# Patient Record
Sex: Female | Born: 1965 | Race: White | Hispanic: Yes | Smoking: Never smoker
Health system: Southern US, Community
[De-identification: ages and names within clinical notes are randomized; demographics above are authoritative.]

## PROBLEM LIST (undated history)

## (undated) DIAGNOSIS — G971 Other reaction to spinal and lumbar puncture: Secondary | ICD-10-CM

## (undated) DIAGNOSIS — Z973 Presence of spectacles and contact lenses: Secondary | ICD-10-CM

## (undated) DIAGNOSIS — E7800 Pure hypercholesterolemia, unspecified: Secondary | ICD-10-CM

## (undated) DIAGNOSIS — Z920 Personal history of contraception: Secondary | ICD-10-CM

## (undated) DIAGNOSIS — D649 Anemia, unspecified: Secondary | ICD-10-CM

## (undated) DIAGNOSIS — I10 Essential (primary) hypertension: Secondary | ICD-10-CM

## (undated) DIAGNOSIS — N941 Unspecified dyspareunia: Secondary | ICD-10-CM

## (undated) DIAGNOSIS — M1990 Unspecified osteoarthritis, unspecified site: Secondary | ICD-10-CM

## (undated) DIAGNOSIS — K5790 Diverticulosis of intestine, part unspecified, without perforation or abscess without bleeding: Secondary | ICD-10-CM

## (undated) HISTORY — DX: Unspecified osteoarthritis, unspecified site: M19.90

## (undated) HISTORY — DX: Diverticulosis of intestine, part unspecified, without perforation or abscess without bleeding: K57.90

## (undated) HISTORY — DX: Unspecified dyspareunia: N94.10

## (undated) HISTORY — PX: KNEE ARTHROSCOPY: SUR90

## (undated) HISTORY — PX: COLONOSCOPY: SHX174

## (undated) HISTORY — DX: Personal history of contraception: Z92.0

## (undated) HISTORY — PX: OTHER SURGICAL HISTORY: SHX169

---

## 2002-04-11 ENCOUNTER — Other Ambulatory Visit: Admission: RE | Admit: 2002-04-11 | Discharge: 2002-04-11 | Payer: Self-pay | Admitting: Internal Medicine

## 2003-04-15 ENCOUNTER — Other Ambulatory Visit: Admission: RE | Admit: 2003-04-15 | Discharge: 2003-04-15 | Payer: Self-pay | Admitting: Internal Medicine

## 2003-05-29 ENCOUNTER — Ambulatory Visit (HOSPITAL_BASED_OUTPATIENT_CLINIC_OR_DEPARTMENT_OTHER): Admission: RE | Admit: 2003-05-29 | Discharge: 2003-05-29 | Payer: Self-pay | Admitting: Orthopedic Surgery

## 2004-09-15 ENCOUNTER — Other Ambulatory Visit: Admission: RE | Admit: 2004-09-15 | Discharge: 2004-09-15 | Payer: Self-pay | Admitting: Internal Medicine

## 2006-06-28 ENCOUNTER — Ambulatory Visit: Payer: Self-pay | Admitting: Oncology

## 2010-06-05 NOTE — Op Note (Signed)
NAMEMANNIE, Ashley [name]                             ACCOUNT NO.:  [id number]   MEDICAL RECORD NO.:  [id number]                   PATIENT TYPE:  AMB   LOCATION:  DSC                                  FACILITY:  [facility]   PHYSICIAN:  Harvie Junior, M.D.                DATE OF BIRTH:  January 07, 1966   DATE OF PROCEDURE:  05/29/2003  DATE OF DISCHARGE:                                 OPERATIVE REPORT   PREOPERATIVE DIAGNOSIS:  Osteochondral defect medial femoral condyle.   POSTOPERATIVE DIAGNOSES:  1. Osteochondral defect medial femoral condyle.  2. Multiple osteocartilaginous loose bodies.  3. Chondromalacia patella.   PROCEDURES:  1. Debridement of medial femoral condyle with a microfracture technique.  2. Debridement of patellofemoral chondromalacia by way of chondroplasty.  3. Removal of multiple osteocartilaginous loose bodies.   SURGEON:  Harvie Junior, M.D.   ASSISTANT:  Marshia Ly, P.A.   ANESTHESIA:  General.   INDICATIONS:  [name] is a 45 year old female with a long history of  having significant pain in her right knee.  She was ultimately evaluated in  our office and by MRI and found to have an unstable articular cartilage  lesion.  She had tried conservative care which was not successful.  Because  of continued complaints of pain, locking and catching, she is taken to the  operating room for operative knee arthroscopy.   DESCRIPTION OF PROCEDURE:  The patient was taken to operating room.  After  adequate anesthesia was obtained with a general anesthetic, the patient was  placed supine on the operating table with the right knee prepped and draped  in the usual sterile fashion.  Following this, routine arthroscopic  examination of the knee revealed that there was an obvious large  osteochondral flap lesion on the medial femoral condyle.  This was evaluated  and noted to have no bone on the back side of the articular cartilage and  such would not be amenable to any sort of  healing technique.  At this time,  the flap was debrided and the base of the crater was awled with the angled  awls.  Excellent bleeding was revealed at the base of the crater.  Attention  at the time was turned to the rest of the knee. During the knee exam,  multiple osteocartilaginous loose bodies were encountered and removed with  the grasper.  The medial and lateral meniscus were probed and felt to be  within normal limits.  ACL was within normal limits.   Attention was turned to the patellofemoral joint where there was some grade  II chondromalacia on the posterior surface of the patella which was  debrided.  The patellar tracking was midline.  Final check was made for any  loose  fragmented pieces, seeing none.  The knee was then copiously irrigated and  suctioned dry.  The wounds were then closed with a bandage.  Sterile  compressive dressing was applied, and the patient was taken to the recovery  room and noted to be in satisfactory condition.  Estimated blood loss for  the procedure was none.                                               Harvie Junior, M.D.    Ranae Plumber  D:  05/29/2003  T:  05/30/2003  Job:  [id number]

## 2010-09-08 ENCOUNTER — Ambulatory Visit (HOSPITAL_BASED_OUTPATIENT_CLINIC_OR_DEPARTMENT_OTHER)
Admission: RE | Admit: 2010-09-08 | Discharge: 2010-09-08 | Disposition: A | Discharge status: Home / Self Care | Payer: BC Managed Care – PPO | Source: Ambulatory Visit | Attending: Orthopaedic Surgery | Admitting: Orthopaedic Surgery

## 2010-09-08 DIAGNOSIS — IMO0002 Reserved for concepts with insufficient information to code with codable children: Secondary | ICD-10-CM | POA: Insufficient documentation

## 2010-09-08 DIAGNOSIS — Z01812 Encounter for preprocedural laboratory examination: Secondary | ICD-10-CM | POA: Insufficient documentation

## 2010-09-08 DIAGNOSIS — X58XXXA Exposure to other specified factors, initial encounter: Secondary | ICD-10-CM | POA: Insufficient documentation

## 2010-09-08 DIAGNOSIS — M1710 Unilateral primary osteoarthritis, unspecified knee: Secondary | ICD-10-CM | POA: Insufficient documentation

## 2010-09-08 LAB — POCT HEMOGLOBIN-HEMACUE: Hemoglobin: 13.5 g/dL (ref 12.0–15.0)

## 2010-09-13 NOTE — Op Note (Signed)
[name], STAFFA               ACCOUNT NO.:  [id number]  MEDICAL RECORD NO.:  [id number]  LOCATION:                                 FACILITY:  PHYSICIAN:  Ashley [name]. Ashley [name], M.D.     DATE OF BIRTH:  DATE OF PROCEDURE:  09/08/2010 DATE OF DISCHARGE:                              OPERATIVE REPORT   PREOPERATIVE DIAGNOSES: 1. Knee torn medial meniscus. 2. Right knee degenerative joint disease.  POSTOPERATIVE DIAGNOSES: 1. Knee torn medial meniscus. 2. Right knee degenerative joint disease.  PROCEDURES: 1. Right knee partial meniscectomy. 2. Right knee abrasion chondroplasty.  ANESTHESIA:  General.  ATTENDING SURGEON:  Ashley [name]. Ashley Blatchley, MD   INDICATIONS FOR PROCEDURE:  The patient is a 45 year old woman with a long history of right knee pain.  She had an arthroscopy 8 or 9 years back which helped for quite sometime.  Since that time she developed recurrent pain over the last year or 2.  We treated with various injectables as well as pills.  She has pain which limits her ability to rest and walk.  She does have some degenerative changes which were moderate and at age 70 we are hoping that an arthroscopy will delay the inevitable knee replacement.  Informed operative consent was obtained after discussion of possible complications including reaction to anesthesia and infection.  SUMMARY/FINDINGS/PROCEDURE:  Under general anesthesia, an arthroscopy of the right knee was performed.  She did have an authentic medial meniscus tear which appeared to be impinging in the joint.  A partial medial meniscectomy was done removing about 10% of what was left of her meniscus.  This was taken back to a stable rim.  She did have some grade 4 change on tibial plateau in a dime sized area and some corresponding areas on the femur.  ACL was intact.  There was bit of a spur anterior to this which I removed a portion up.  The lateral compartment was completely benign.  The patellofemoral  portion of the knee exhibited some significant grade 4 change across the broad area in the inter- trochlear groove.  This also at the apex of patella, some grade 3 change.  A thorough chondroplasty was done along with abrasion to bleeding bone in some small areas.  DESCRIPTION OF PROCEDURE:  The patient was brought to the operating suite where general anesthetic was applied without difficulty.  She was positioned in supine, prepped and draped in normal sterile fashion. After administration of IV Kefzol, an arthroscopy of the right knee was formed through total of two portals.  Findings were as noted above and procedure consisted of a partial medial meniscectomy done with a basket and shaver followed by abrasion chondroplasty as outlined above. Thorough chondroplasties were also done medial and undersurface of the patella.  The knee was thoroughly irrigated followed by placement of Marcaine with epinephrine and morphine.  Adaptic was placed on the portals followed by dry gauze and loose Ace wrap.  Estimated blood loss and fluids obtained from anesthesia records.  DISPOSITION:  The patient was extubated in the operating room and taken to recovery room in stable addition.  She wish to go home same day and follow  up in the office once a week.  I will contact her by phone tonight.     Ashley [name], M.D.     PGD/MEDQ  D:  09/08/2010  T:  09/08/2010  Job:  [id number]  Electronically Signed by Ashley [name] M.D. on 09/13/2010 12:02:27 PM

## 2014-12-04 HISTORY — PX: COLONOSCOPY: SHX174

## 2015-08-19 ENCOUNTER — Other Ambulatory Visit: Payer: Self-pay | Admitting: Orthopaedic Surgery

## 2015-08-21 ENCOUNTER — Other Ambulatory Visit: Payer: Self-pay | Admitting: Orthopaedic Surgery

## 2015-09-18 NOTE — Pre-Procedure Instructions (Signed)
Ashley [name]  09/18/2015      [id number] - [place], [name] [address] [name] [id number] Phone: [phone] Fax: [phone]    Your procedure is scheduled on Tuesday, Sept 12.  [facility] Admitting at 8:15 A.M.  Call this number if you have problems the morning of surgery:  [phone]   Remember:  Do not eat food or drink liquids after midnight.  Take these medicines the morning of surgery with A SIP OF WATER  Tramadol (Ultram) or Tylenol if needed  Stop taking aspirin, BC's, Goody's, Herbal medications, Fish Oil, Aleve, Ibuprofen, Advil, Motrin, vitamins   Do not wear jewelry, make-up or nail polish.  Do not wear lotions, powders, or perfumes, or deoderant.  Do not shave 48 hours prior to surgery.  Men may shave face and neck.  Do not bring valuables to the hospital.  [facility] is not responsible for any belongings or valuables.  Contacts, dentures or bridgework may not be worn into surgery.  Leave your suitcase in the car.  After surgery it may be brought to your room.  For patients admitted to the hospital, discharge time will be determined by your treatment team.  Patients discharged the day of surgery will not be allowed to drive home.    Special instructions: [place] - Preparing for Surgery  Before surgery, you can play an important role.  Because skin is not sterile, your skin needs to be as free of germs as possible.  You can reduce the number of germs on you skin by washing with CHG (chlorahexidine gluconate) soap before surgery.  CHG is an antiseptic cleaner which kills germs and bonds with the skin to continue killing germs even after washing.  Please DO NOT use if you have an allergy to CHG or antibacterial soaps.  If your skin becomes reddened/irritated stop using the CHG and inform your nurse when you arrive at Short Stay.  Do not shave (including legs and underarms) for at least 48 hours  prior to the first CHG shower.  You may shave your face.  Please follow these instructions carefully:   1.  Shower with CHG Soap the night before surgery and the   morning of Surgery.  2.  If you choose to wash your hair, wash your hair first as usual with your  normal shampoo.  3.  After you shampoo, rinse your hair and body thoroughly to remove the Shampoo.  4.  Use CHG as you would any other liquid soap.  You can apply chg directly to the skin and wash gently with scrungie or a clean washcloth.  5.  Apply the CHG Soap to your body ONLY FROM THE NECK DOWN.  Do not use on open wounds or open sores.  Avoid contact with your eyes,  ears, mouth and genitals (private parts).  Wash genitals (private parts)  with your normal soap.  6.  Wash thoroughly, paying special attention to the area where your surgery  will be performed.  7.  Thoroughly rinse your body with warm water from the neck down.  8.  DO NOT shower/wash with your normal soap after using and rinsing off  the CHG Soap.  9.  Pat yourself dry with a clean towel.            10.  Wear clean pajamas.            11.  Place  clean sheets on your bed the night of your first shower and do not sleep with pets.  Day of Surgery  Do not apply any lotions/deoderants the morning of surgery.  Please wear clean clothes to the hospital/surgery center.   Please read over the following fact sheets that you were given. Pain Booklet, MRSA Information and Surgical Site Infection Prevention

## 2015-09-19 ENCOUNTER — Encounter (HOSPITAL_COMMUNITY): Payer: Self-pay | Admitting: General Practice

## 2015-09-19 ENCOUNTER — Ambulatory Visit (HOSPITAL_COMMUNITY)
Admission: RE | Admit: 2015-09-19 | Discharge: 2015-09-19 | Disposition: A | Discharge status: Home / Self Care | Payer: Managed Care, Other (non HMO) | Source: Ambulatory Visit | Attending: Orthopaedic Surgery | Admitting: Orthopaedic Surgery

## 2015-09-19 ENCOUNTER — Encounter (HOSPITAL_COMMUNITY)
Admission: RE | Admit: 2015-09-19 | Discharge: 2015-09-19 | Disposition: A | Discharge status: Home / Self Care | Payer: Managed Care, Other (non HMO) | Source: Ambulatory Visit | Attending: Orthopaedic Surgery | Admitting: Orthopaedic Surgery

## 2015-09-19 DIAGNOSIS — M170 Bilateral primary osteoarthritis of knee: Secondary | ICD-10-CM | POA: Insufficient documentation

## 2015-09-19 DIAGNOSIS — Z01812 Encounter for preprocedural laboratory examination: Secondary | ICD-10-CM | POA: Diagnosis not present

## 2015-09-19 DIAGNOSIS — R9431 Abnormal electrocardiogram [ECG] [EKG]: Secondary | ICD-10-CM | POA: Insufficient documentation

## 2015-09-19 DIAGNOSIS — Z01818 Encounter for other preprocedural examination: Secondary | ICD-10-CM | POA: Insufficient documentation

## 2015-09-19 DIAGNOSIS — Z0183 Encounter for blood typing: Secondary | ICD-10-CM | POA: Diagnosis not present

## 2015-09-19 HISTORY — DX: Other reaction to spinal and lumbar puncture: G97.1

## 2015-09-19 HISTORY — DX: Presence of spectacles and contact lenses: Z97.3

## 2015-09-19 HISTORY — DX: Anemia, unspecified: D64.9

## 2015-09-19 LAB — BASIC METABOLIC PANEL
Anion gap: 8 (ref 5–15)
BUN: 12 mg/dL (ref 6–20)
CHLORIDE: 104 mmol/L (ref 101–111)
CO2: 27 mmol/L (ref 22–32)
Calcium: 9.6 mg/dL (ref 8.9–10.3)
Creatinine, Ser: 0.85 mg/dL (ref 0.44–1.00)
GFR calc Af Amer: >60 mL/min (ref >60)
GFR calc non Af Amer: >60 mL/min (ref >60)
GLUCOSE: 79 mg/dL (ref 65–99)
POTASSIUM: 3.8 mmol/L (ref 3.5–5.1)
Sodium: 139 mmol/L (ref 135–145)

## 2015-09-19 LAB — SURGICAL PCR SCREEN
MRSA, PCR: NEGATIVE
Staphylococcus aureus: NEGATIVE

## 2015-09-19 LAB — PROTIME-INR
INR: 1.03
Prothrombin Time: 13.5 seconds (ref 11.4–15.2)

## 2015-09-19 LAB — CBC WITH DIFFERENTIAL/PLATELET
BASOS ABS: 0 10*3/uL (ref 0.0–0.1)
BASOS PCT: 0 %
EOS PCT: 1 %
Eosinophils Absolute: 0.1 10*3/uL (ref 0.0–0.7)
HCT: 39.9 % (ref 36.0–46.0)
Hemoglobin: 13.2 g/dL (ref 12.0–15.0)
Lymphocytes Relative: 41 %
Lymphs Abs: 2 10*3/uL (ref 0.7–4.0)
MCH: 30.2 pg (ref 26.0–34.0)
MCHC: 33.1 g/dL (ref 30.0–36.0)
MCV: 91.3 fL (ref 78.0–100.0)
MONO ABS: 0.5 10*3/uL (ref 0.1–1.0)
MONOS PCT: 10 %
Neutro Abs: 2.3 10*3/uL (ref 1.7–7.7)
Neutrophils Relative %: 48 %
PLATELETS: 268 10*3/uL (ref 150–400)
RBC: 4.37 MIL/uL (ref 3.87–5.11)
RDW: 14.1 % (ref 11.5–15.5)
WBC: 4.8 10*3/uL (ref 4.0–10.5)

## 2015-09-19 LAB — URINALYSIS, ROUTINE W REFLEX MICROSCOPIC
BILIRUBIN URINE: NEGATIVE
GLUCOSE, UA: NEGATIVE mg/dL
Ketones, ur: NEGATIVE mg/dL
Leukocytes, UA: NEGATIVE
Nitrite: NEGATIVE
Protein, ur: NEGATIVE mg/dL
SPECIFIC GRAVITY, URINE: 1.007 (ref 1.005–1.030)
pH: 7 (ref 5.0–8.0)

## 2015-09-19 LAB — URINE MICROSCOPIC-ADD ON

## 2015-09-19 LAB — ABO/RH: ABO/RH(D): O NEG

## 2015-09-19 LAB — APTT: aPTT: 28 seconds (ref 24–36)

## 2015-09-19 LAB — HCG, SERUM, QUALITATIVE: PREG SERUM: NEGATIVE

## 2015-09-19 NOTE — Progress Notes (Addendum)
PCP - Dr. Juleen ChinaLynley Holt - pt. States that she has not seen her PCP in years Cardiologist - denies  EKG - 09/19/15 CXR - 09/19/15  Echo/stress test/cardiac cath - denies  Patient denies chest pain and shortness of breath at PAT appointment.    Patient is an autologous blood donor.  Spoke with Bonita QuinLinda in blood bank who states that 1 unit of blood has been received as of 09/19/15.  Patient states that she will be donating second unit on 09/19/15.  Nurse notified Bonita QuinLinda in blood bank that second unit will be donated.

## 2015-09-23 NOTE — Progress Notes (Signed)
Anesthesia Chart Review: Patient is a 50 year old female scheduled for bilateral TKA on 09/30/15 by Dr. Jerl Santosalldorf. Anesthesia type is posted for Choice.  History includes never smoker, anemia, spinal headache (after c-section), knee arthroscopies (right '05, '12). No history of hypertension, although BP was elevated at 152/95 at PAT (unfortunately, not rechecked). PCP is Dr. Juleen ChinaLynley Holt at [facility] in [place].   Meds include 65 Fe, ibuprofen, tramadol, Vitamin B12 and D3.   BP (!) 152/95   Pulse 77   Temp 37.1 C (Oral)   Resp 18   Ht 5\' 5"  (1.651 m)   Wt 157 lb 4 oz (71.3 kg)   SpO2 100%   BMI 26.17 kg/m    09/19/15 EKG: NSR, possible LAE, LVH. [place] previous [place] [facility] [address] She denied history of stress, echo, or cath. She denied CP and SOB at PAT.   09/19/15 CXR: FINDINGS: Lungs are clear. Heart size and pulmonary vascularity are normal. No adenopathy. No bone lesions. IMPRESSION: No edema or consolidation.  Preoperative labs noted--WNL. Serum pregnancy was negative. She is a autologous blood donor. (According to PAT RN notes, as of 09/19/15 10:50AM, Blood Bank had received 1 unit with second unit to be donated by patient on 09/19/15.)  She will get vitals on arrival. Further evaluation by her anesthesiologist on the day of surgery. If no acute changes then I anticipate that she can proceed as planned.  Velna Ochsllison Rainna Nearhood, PA-C [facility]/Anesthesiology Phone [phone] 09/23/2015 10:51 AM

## 2015-09-27 NOTE — H&P (Signed)
TOTAL KNEE ADMISSION H&P  Patient is being admitted for bilaterally total knee arthroplasty.  Subjective:  Chief Complaint:bilaterally knee pain.  HPI: Ashley [name], 50 y.o. female, has a history of pain and functional disability in the bilaterally knee due to arthritis and has failed non-surgical conservative treatments for greater than 12 weeks to includeNSAID's and/or analgesics, corticosteriod injections, viscosupplementation injections, flexibility and strengthening excercises, supervised PT with diminished ADL's post treatment, use of assistive devices, weight reduction as appropriate and activity modification.  Onset of symptoms was gradual, starting 5 years ago with gradually worsening course since that time. The patient noted prior procedures on the knee to include  arthroscopy on the bilaterally knee(s).  Patient currently rates pain in the bilaterally knee(s) at 10 out of 10 with activity. Patient has night pain, worsening of pain with activity and weight bearing, pain that interferes with activities of daily living, crepitus and joint swelling.  Patient has evidence of subchondral cysts, subchondral sclerosis, periarticular osteophytes and joint space narrowing by imaging studies. There is no active infection.  There are no active problems to display for this patient.  Past Medical History:  Diagnosis Date  . Anemia   . Spinal headache    after c-section  . Wears glasses     Past Surgical History:  Procedure Laterality Date  . CESAREAN SECTION  1992  . COLONOSCOPY    . KNEE ARTHROSCOPY Right    x2    No prescriptions prior to admission.   No Known Allergies  Social History  Substance Use Topics  . Smoking status: Never Smoker  . Smokeless tobacco: Never Used  . Alcohol use Yes     Comment: occasionally    No family history on file.   Review of Systems  Musculoskeletal: Positive for joint pain.       Bilateral knee  All other systems reviewed and are  negative.   Objective:  Physical Exam  Constitutional: She is oriented to person, place, and time. She appears well-developed and well-nourished.  HENT:  Head: Normocephalic and atraumatic.  Eyes: Pupils are equal, round, and reactive to light.  Neck: Normal range of motion.  Cardiovascular: Normal rate and regular rhythm.   Respiratory: Effort normal.  GI: Soft.  Musculoskeletal:  Both knees continue with range of motion from about 0-120. She has medial joint line pain on both sides and some crepitation especially on the right. Hip motion is full and straight leg raise is negative. Sensation is intact in her feet with palpable pulses on both sides.   Neurological: She is alert and oriented to person, place, and time.  Skin: Skin is warm and dry.  Psychiatric: She has a normal mood and affect. Her behavior is normal. Judgment and thought content normal.    Vital signs in last 24 hours:    Labs:   Estimated body mass index is 26.17 kg/m as calculated from the following:   Height as of 09/19/15: 5\' 5"  (1.651 m).   Weight as of 09/19/15: 71.3 kg (157 lb 4 oz).   Imaging Review Plain radiographs demonstrate severe degenerative joint disease of the bilaterally knee(s). The overall alignment isneutral. The bone quality appears to be good for age and reported activity level.  Assessment/Plan:  End stage primary arthritis, bilaterally knee   The patient history, physical examination, clinical judgment of the provider and imaging studies are consistent with end stage degenerative joint disease of the bilaterally knee(s) and total knee arthroplasty is deemed medically necessary. The  treatment options including medical management, injection therapy arthroscopy and arthroplasty were discussed at length. The risks and benefits of total knee arthroplasty were presented and reviewed. The risks due to aseptic loosening, infection, stiffness, patella tracking problems, thromboembolic  complications and other imponderables were discussed. The patient acknowledged the explanation, agreed to proceed with the plan and consent was signed. Patient is being admitted for inpatient treatment for surgery, pain control, PT, OT, prophylactic antibiotics, VTE prophylaxis, progressive ambulation and ADL's and discharge planning. The patient is planning to be discharged home with home health services

## 2015-09-29 MED ORDER — CEFAZOLIN SODIUM-DEXTROSE 2-4 GM/100ML-% IV SOLN
2.0000 g | INTRAVENOUS | Status: AC
  Administered 2015-09-30: 2 g via INTRAVENOUS
  Filled 2015-09-29: qty 100

## 2015-09-29 MED ORDER — LACTATED RINGERS IV SOLN
INTRAVENOUS | Status: DC
  Administered 2015-09-30 (×3): via INTRAVENOUS

## 2015-09-30 ENCOUNTER — Inpatient Hospital Stay (HOSPITAL_COMMUNITY)
Admission: RE | Admit: 2015-09-30 | Discharge: 2015-10-02 | DRG: 462 | Disposition: A | Discharge status: Home Health | Payer: Managed Care, Other (non HMO) | Source: Ambulatory Visit | Attending: Orthopaedic Surgery | Admitting: Orthopaedic Surgery

## 2015-09-30 ENCOUNTER — Encounter (HOSPITAL_COMMUNITY)
Admission: RE | Disposition: A | Discharge status: Home Health | Payer: Self-pay | Source: Ambulatory Visit | Attending: Orthopaedic Surgery

## 2015-09-30 ENCOUNTER — Inpatient Hospital Stay (HOSPITAL_COMMUNITY): Payer: Managed Care, Other (non HMO) | Admitting: Vascular Surgery

## 2015-09-30 ENCOUNTER — Encounter (HOSPITAL_COMMUNITY): Payer: Self-pay | Admitting: Certified Registered Nurse Anesthetist

## 2015-09-30 ENCOUNTER — Inpatient Hospital Stay (HOSPITAL_COMMUNITY): Payer: Managed Care, Other (non HMO) | Admitting: Anesthesiology

## 2015-09-30 DIAGNOSIS — M25562 Pain in left knee: Secondary | ICD-10-CM | POA: Diagnosis present

## 2015-09-30 DIAGNOSIS — M170 Bilateral primary osteoarthritis of knee: Secondary | ICD-10-CM | POA: Diagnosis present

## 2015-09-30 HISTORY — PX: TOTAL KNEE ARTHROPLASTY: SHX125

## 2015-09-30 LAB — PREPARE RBC (CROSSMATCH)

## 2015-09-30 SURGERY — ARTHROPLASTY, KNEE, BILATERAL, TOTAL
Anesthesia: Spinal | Site: Knee | Laterality: Bilateral

## 2015-09-30 MED ORDER — METHOCARBAMOL 1000 MG/10ML IJ SOLN
500.0000 mg | Freq: Four times a day (QID) | INTRAVENOUS | Status: DC | PRN
  Filled 2015-09-30: qty 5

## 2015-09-30 MED ORDER — ONDANSETRON HCL 4 MG/2ML IJ SOLN
INTRAMUSCULAR | Status: AC
  Filled 2015-09-30: qty 2

## 2015-09-30 MED ORDER — TRANEXAMIC ACID 1000 MG/10ML IV SOLN
1000.0000 mg | INTRAVENOUS | Status: AC
  Administered 2015-09-30: 1000 mg via INTRAVENOUS
  Filled 2015-09-30: qty 10

## 2015-09-30 MED ORDER — FENTANYL CITRATE (PF) 100 MCG/2ML IJ SOLN
INTRAMUSCULAR | Status: AC
  Filled 2015-09-30: qty 2

## 2015-09-30 MED ORDER — CHLORHEXIDINE GLUCONATE 4 % EX LIQD: 60.0000 mL | Freq: Once | CUTANEOUS | Status: DC

## 2015-09-30 MED ORDER — ONDANSETRON HCL 4 MG/2ML IJ SOLN: 4.0000 mg | Freq: Four times a day (QID) | INTRAMUSCULAR | Status: DC | PRN

## 2015-09-30 MED ORDER — BUPIVACAINE-EPINEPHRINE (PF) 0.5% -1:200000 IJ SOLN
INTRAMUSCULAR | Status: AC
  Filled 2015-09-30: qty 60

## 2015-09-30 MED ORDER — DOCUSATE SODIUM 100 MG PO CAPS
100.0000 mg | ORAL_CAPSULE | Freq: Two times a day (BID) | ORAL | Status: DC
  Administered 2015-09-30 – 2015-10-01 (×3): 100 mg via ORAL
  Filled 2015-09-30 (×3): qty 1

## 2015-09-30 MED ORDER — LACTATED RINGERS IV SOLN
INTRAVENOUS | Status: DC
  Administered 2015-09-30: 22:00:00 via INTRAVENOUS

## 2015-09-30 MED ORDER — MENTHOL 3 MG MT LOZG
1.0000 | LOZENGE | OROMUCOSAL | Status: DC | PRN
Start: 2015-09-30 — End: 2015-10-02

## 2015-09-30 MED ORDER — HYDROMORPHONE HCL 1 MG/ML IJ SOLN
0.5000 mg | INTRAMUSCULAR | Status: DC | PRN
  Administered 2015-09-30 – 2015-10-01 (×3): 1 mg via INTRAVENOUS
  Filled 2015-09-30 (×3): qty 1

## 2015-09-30 MED ORDER — METHOCARBAMOL 500 MG PO TABS
ORAL_TABLET | ORAL | Status: AC
  Filled 2015-09-30: qty 1

## 2015-09-30 MED ORDER — ONDANSETRON HCL 4 MG/2ML IJ SOLN
INTRAMUSCULAR | Status: DC | PRN
  Administered 2015-09-30: 4 mg via INTRAVENOUS

## 2015-09-30 MED ORDER — GLYCOPYRROLATE 0.2 MG/ML IJ SOLN
INTRAMUSCULAR | Status: DC | PRN
  Administered 2015-09-30 (×2): 0.1 mg via INTRAVENOUS

## 2015-09-30 MED ORDER — METOCLOPRAMIDE HCL 5 MG PO TABS: 5.0000 mg | ORAL_TABLET | Freq: Three times a day (TID) | ORAL | Status: DC | PRN

## 2015-09-30 MED ORDER — PROPOFOL 10 MG/ML IV BOLUS
INTRAVENOUS | Status: AC
  Filled 2015-09-30: qty 20

## 2015-09-30 MED ORDER — ACETAMINOPHEN 650 MG RE SUPP: 650.0000 mg | Freq: Four times a day (QID) | RECTAL | Status: DC | PRN

## 2015-09-30 MED ORDER — METOPROLOL TARTARATE 1 MG/ML SYRINGE (5ML)
Status: DC | PRN
  Administered 2015-09-30 (×2): 1 mg via INTRAVENOUS
  Administered 2015-09-30: 2 mg via INTRAVENOUS
  Administered 2015-09-30: 1 mg via INTRAVENOUS

## 2015-09-30 MED ORDER — METOPROLOL TARTRATE 5 MG/5ML IV SOLN
INTRAVENOUS | Status: AC
  Filled 2015-09-30: qty 5

## 2015-09-30 MED ORDER — FENTANYL CITRATE (PF) 100 MCG/2ML IJ SOLN
INTRAMUSCULAR | Status: DC | PRN
  Administered 2015-09-30 (×2): 50 ug via INTRAVENOUS

## 2015-09-30 MED ORDER — BUPIVACAINE LIPOSOME 1.3 % IJ SUSP
20.0000 mL | INTRAMUSCULAR | Status: AC
  Administered 2015-09-30: 20 mL
  Filled 2015-09-30: qty 20

## 2015-09-30 MED ORDER — KETAMINE HCL 10 MG/ML IJ SOLN
INTRAMUSCULAR | Status: DC | PRN
  Administered 2015-09-30: 10 mg via INTRAVENOUS
  Administered 2015-09-30 (×3): 20 mg via INTRAVENOUS
  Administered 2015-09-30: 10 mg via INTRAVENOUS

## 2015-09-30 MED ORDER — KETAMINE HCL-SODIUM CHLORIDE 100-0.9 MG/10ML-% IV SOSY
PREFILLED_SYRINGE | INTRAVENOUS | Status: AC
  Filled 2015-09-30: qty 10

## 2015-09-30 MED ORDER — LIDOCAINE 2% (20 MG/ML) 5 ML SYRINGE
INTRAMUSCULAR | Status: AC
  Filled 2015-09-30: qty 5

## 2015-09-30 MED ORDER — LABETALOL HCL 5 MG/ML IV SOLN
INTRAVENOUS | Status: DC | PRN
  Administered 2015-09-30: 10 mg via INTRAVENOUS
  Administered 2015-09-30 (×2): 5 mg via INTRAVENOUS

## 2015-09-30 MED ORDER — PROPOFOL 1000 MG/100ML IV EMUL
INTRAVENOUS | Status: AC
  Filled 2015-09-30: qty 200

## 2015-09-30 MED ORDER — SODIUM CHLORIDE 0.9 % IR SOLN
Status: DC | PRN
  Administered 2015-09-30: 1000 mL

## 2015-09-30 MED ORDER — PHENYLEPHRINE HCL 10 MG/ML IJ SOLN
INTRAVENOUS | Status: DC | PRN
  Administered 2015-09-30: 25 ug/min via INTRAVENOUS

## 2015-09-30 MED ORDER — TRANEXAMIC ACID 1000 MG/10ML IV SOLN
1000.0000 mg | Freq: Once | INTRAVENOUS | Status: DC
  Filled 2015-09-30: qty 10

## 2015-09-30 MED ORDER — METOCLOPRAMIDE HCL 5 MG/ML IJ SOLN: 5.0000 mg | Freq: Three times a day (TID) | INTRAMUSCULAR | Status: DC | PRN

## 2015-09-30 MED ORDER — BUPIVACAINE IN DEXTROSE 0.75-8.25 % IT SOLN
INTRATHECAL | Status: DC | PRN
  Administered 2015-09-30: 1.8 mL via INTRATHECAL

## 2015-09-30 MED ORDER — ONDANSETRON HCL 4 MG PO TABS: 4.0000 mg | ORAL_TABLET | Freq: Four times a day (QID) | ORAL | Status: DC | PRN

## 2015-09-30 MED ORDER — HYDRALAZINE HCL 20 MG/ML IJ SOLN
INTRAMUSCULAR | Status: DC | PRN
  Administered 2015-09-30 (×2): 5 mg via INTRAVENOUS

## 2015-09-30 MED ORDER — BISACODYL 5 MG PO TBEC: 5.0000 mg | DELAYED_RELEASE_TABLET | Freq: Every day | ORAL | Status: DC | PRN

## 2015-09-30 MED ORDER — PROMETHAZINE HCL 25 MG/ML IJ SOLN: 6.2500 mg | INTRAMUSCULAR | Status: DC | PRN

## 2015-09-30 MED ORDER — HYDROMORPHONE HCL 1 MG/ML IJ SOLN
INTRAMUSCULAR | Status: AC
  Filled 2015-09-30: qty 1

## 2015-09-30 MED ORDER — TRANEXAMIC ACID 1000 MG/10ML IV SOLN
2000.0000 mg | INTRAVENOUS | Status: AC
  Administered 2015-09-30: 2000 mg via TOPICAL
  Filled 2015-09-30: qty 20

## 2015-09-30 MED ORDER — LIDOCAINE HCL (CARDIAC) 20 MG/ML IV SOLN
INTRAVENOUS | Status: DC | PRN
  Administered 2015-09-30: 30 mg via INTRATRACHEAL

## 2015-09-30 MED ORDER — BUPIVACAINE-EPINEPHRINE (PF) 0.5% -1:200000 IJ SOLN
INTRAMUSCULAR | Status: DC | PRN
  Administered 2015-09-30: 40 mL

## 2015-09-30 MED ORDER — HYDROCODONE-ACETAMINOPHEN 5-325 MG PO TABS
ORAL_TABLET | ORAL | Status: AC
  Filled 2015-09-30: qty 1

## 2015-09-30 MED ORDER — HYDRALAZINE HCL 20 MG/ML IJ SOLN
INTRAMUSCULAR | Status: AC
  Filled 2015-09-30: qty 1

## 2015-09-30 MED ORDER — ACETAMINOPHEN 325 MG PO TABS: 650.0000 mg | ORAL_TABLET | Freq: Four times a day (QID) | ORAL | Status: DC | PRN

## 2015-09-30 MED ORDER — PROPOFOL 10 MG/ML IV BOLUS
INTRAVENOUS | Status: DC | PRN
  Administered 2015-09-30: 25 mg via INTRAVENOUS
  Administered 2015-09-30: 30 mg via INTRAVENOUS
  Administered 2015-09-30: 50 mg via INTRAVENOUS
  Administered 2015-09-30: 20 mg via INTRAVENOUS
  Administered 2015-09-30: 30 mg via INTRAVENOUS
  Administered 2015-09-30: 25 mg via INTRAVENOUS
  Administered 2015-09-30: 20 mg via INTRAVENOUS
  Administered 2015-09-30: 40 mg via INTRAVENOUS
  Administered 2015-09-30: 30 mg via INTRAVENOUS

## 2015-09-30 MED ORDER — ALUM & MAG HYDROXIDE-SIMETH 200-200-20 MG/5ML PO SUSP: 30.0000 mL | ORAL | Status: DC | PRN

## 2015-09-30 MED ORDER — SODIUM CHLORIDE 0.9 % IJ SOLN
INTRAMUSCULAR | Status: DC | PRN
  Administered 2015-09-30: 40 mL

## 2015-09-30 MED ORDER — PHENOL 1.4 % MT LIQD: 1.0000 | OROMUCOSAL | Status: DC | PRN

## 2015-09-30 MED ORDER — HYDROMORPHONE HCL 1 MG/ML IJ SOLN
0.2500 mg | INTRAMUSCULAR | Status: DC | PRN
  Administered 2015-09-30 (×4): 0.5 mg via INTRAVENOUS

## 2015-09-30 MED ORDER — METHOCARBAMOL 500 MG PO TABS
500.0000 mg | ORAL_TABLET | Freq: Four times a day (QID) | ORAL | Status: DC | PRN
  Administered 2015-09-30 – 2015-10-02 (×6): 500 mg via ORAL
  Filled 2015-09-30 (×5): qty 1

## 2015-09-30 MED ORDER — HYDROCODONE-ACETAMINOPHEN 5-325 MG PO TABS
1.0000 | ORAL_TABLET | ORAL | Status: DC | PRN
  Administered 2015-09-30: 2 via ORAL
  Administered 2015-09-30: 1 via ORAL
  Administered 2015-10-01 – 2015-10-02 (×7): 2 via ORAL
  Filled 2015-09-30 (×9): qty 2

## 2015-09-30 MED ORDER — ASPIRIN EC 325 MG PO TBEC
325.0000 mg | DELAYED_RELEASE_TABLET | Freq: Two times a day (BID) | ORAL | Status: DC
  Administered 2015-10-01 (×2): 325 mg via ORAL
  Filled 2015-09-30 (×2): qty 1

## 2015-09-30 MED ORDER — MIDAZOLAM HCL 2 MG/2ML IJ SOLN
INTRAMUSCULAR | Status: DC | PRN
  Administered 2015-09-30 (×2): 1 mg via INTRAVENOUS

## 2015-09-30 MED ORDER — SODIUM CHLORIDE 0.9 % IR SOLN
Status: DC | PRN
  Administered 2015-09-30: 3000 mL

## 2015-09-30 MED ORDER — CEFAZOLIN SODIUM-DEXTROSE 2-4 GM/100ML-% IV SOLN
2.0000 g | Freq: Four times a day (QID) | INTRAVENOUS | Status: AC
  Administered 2015-10-01: 2 g via INTRAVENOUS
  Filled 2015-09-30 (×2): qty 100

## 2015-09-30 MED ORDER — GLYCOPYRROLATE 0.2 MG/ML IV SOSY
PREFILLED_SYRINGE | INTRAVENOUS | Status: AC
  Filled 2015-09-30: qty 3

## 2015-09-30 MED ORDER — MIDAZOLAM HCL 2 MG/2ML IJ SOLN
INTRAMUSCULAR | Status: AC
  Filled 2015-09-30: qty 2

## 2015-09-30 MED ORDER — PROPOFOL 500 MG/50ML IV EMUL
INTRAVENOUS | Status: DC | PRN
  Administered 2015-09-30 (×2): 25 ug/kg/min via INTRAVENOUS
  Administered 2015-09-30: 13:00:00 via INTRAVENOUS

## 2015-09-30 MED ORDER — LABETALOL HCL 5 MG/ML IV SOLN
INTRAVENOUS | Status: AC
  Filled 2015-09-30: qty 4

## 2015-09-30 MED ORDER — DIPHENHYDRAMINE HCL 12.5 MG/5ML PO ELIX: 12.5000 mg | ORAL_SOLUTION | ORAL | Status: DC | PRN

## 2015-09-30 SURGICAL SUPPLY — 82 items
BANDAGE ACE 4X5 VEL STRL LF (GAUZE/BANDAGES/DRESSINGS) IMPLANT
BANDAGE ACE 6X5 VEL STRL LF (GAUZE/BANDAGES/DRESSINGS) IMPLANT
BANDAGE ELASTIC 4 VELCRO ST LF (GAUZE/BANDAGES/DRESSINGS) IMPLANT
BANDAGE ELASTIC 6 VELCRO ST LF (GAUZE/BANDAGES/DRESSINGS) ×6 IMPLANT
BANDAGE ESMARK 6X9 LF (GAUZE/BANDAGES/DRESSINGS) ×1 IMPLANT
BLADE SAGITTAL 25.0X1.19X90 (BLADE) ×2 IMPLANT
BLADE SAGITTAL 25.0X1.19X90MM (BLADE) ×1
BLADE SAW RECIP 87.9 MT (BLADE) ×3 IMPLANT
BLADE SAW SGTL 13.0X1.19X90.0M (BLADE) IMPLANT
BLADE SURG 10 STRL SS (BLADE) ×6 IMPLANT
BLADE SURG ROTATE 9660 (MISCELLANEOUS) IMPLANT
BNDG COHESIVE 6X5 TAN STRL LF (GAUZE/BANDAGES/DRESSINGS) ×6 IMPLANT
BNDG ESMARK 6X9 LF (GAUZE/BANDAGES/DRESSINGS) ×3
BNDG GAUZE ELAST 4 BULKY (GAUZE/BANDAGES/DRESSINGS) ×6 IMPLANT
BOWL SMART MIX CTS (DISPOSABLE) ×6 IMPLANT
CAP KNEE TOTAL 3 SIGMA ×6 IMPLANT
CEMENT HV SMART SET (Cement) ×12 IMPLANT
CLOSURE WOUND 1/2 X4 (GAUZE/BANDAGES/DRESSINGS) ×2
COVER SURGICAL LIGHT HANDLE (MISCELLANEOUS) ×3 IMPLANT
CUFF TOURNIQUET SINGLE 34IN LL (TOURNIQUET CUFF) ×6 IMPLANT
CUFF TOURNIQUET SINGLE 44IN (TOURNIQUET CUFF) IMPLANT
DRAPE EXTREMITY BILATERAL (DRAPES) ×3 IMPLANT
DRAPE IMP U-DRAPE 54X76 (DRAPES) ×3 IMPLANT
DRAPE ORTHO SPLIT 87X125 STRL (DRAPES) ×3 IMPLANT
DRAPE PROXIMA HALF (DRAPES) ×6 IMPLANT
DRAPE U-SHAPE 47X51 STRL (DRAPES) ×6 IMPLANT
DRSG ADAPTIC 3X8 NADH LF (GAUZE/BANDAGES/DRESSINGS) ×6 IMPLANT
DRSG PAD ABDOMINAL 8X10 ST (GAUZE/BANDAGES/DRESSINGS) ×6 IMPLANT
DURAPREP 26ML APPLICATOR (WOUND CARE) ×9 IMPLANT
ELECT REM PT RETURN 9FT ADLT (ELECTROSURGICAL) ×3
ELECTRODE REM PT RTRN 9FT ADLT (ELECTROSURGICAL) ×1 IMPLANT
EVACUATOR 1/8 PVC DRAIN (DRAIN) IMPLANT
GAUZE SPONGE 4X4 12PLY STRL (GAUZE/BANDAGES/DRESSINGS) ×6 IMPLANT
GLOVE BIO SURGEON STRL SZ8 (GLOVE) ×12 IMPLANT
GLOVE BIOGEL PI IND STRL 6.5 (GLOVE) ×2 IMPLANT
GLOVE BIOGEL PI IND STRL 7.0 (GLOVE) ×1 IMPLANT
GLOVE BIOGEL PI IND STRL 8 (GLOVE) ×2 IMPLANT
GLOVE BIOGEL PI INDICATOR 6.5 (GLOVE) ×4
GLOVE BIOGEL PI INDICATOR 7.0 (GLOVE) ×2
GLOVE BIOGEL PI INDICATOR 8 (GLOVE) ×4
GLOVE SURG SS PI 6.0 STRL IVOR (GLOVE) ×6 IMPLANT
GLOVE SURG SS PI 6.5 STRL IVOR (GLOVE) ×6 IMPLANT
GOWN STRL REUS W/ TWL LRG LVL3 (GOWN DISPOSABLE) ×3 IMPLANT
GOWN STRL REUS W/ TWL XL LVL3 (GOWN DISPOSABLE) ×2 IMPLANT
GOWN STRL REUS W/TWL 2XL LVL3 (GOWN DISPOSABLE) IMPLANT
GOWN STRL REUS W/TWL LRG LVL3 (GOWN DISPOSABLE) ×6
GOWN STRL REUS W/TWL XL LVL3 (GOWN DISPOSABLE) ×4
HANDPIECE INTERPULSE COAX TIP (DISPOSABLE) ×4
HOOD PEEL AWAY FACE SHEILD DIS (HOOD) ×6 IMPLANT
IMMOBILIZER KNEE 20 (SOFTGOODS) IMPLANT
IMMOBILIZER KNEE 22 UNIV (SOFTGOODS) ×6 IMPLANT
IMMOBILIZER KNEE 24 THIGH 36 (MISCELLANEOUS) IMPLANT
IMMOBILIZER KNEE 24 UNIV (MISCELLANEOUS)
KIT BASIN OR (CUSTOM PROCEDURE TRAY) ×3 IMPLANT
KIT ROOM TURNOVER OR (KITS) ×3 IMPLANT
MANIFOLD NEPTUNE II (INSTRUMENTS) ×3 IMPLANT
NEEDLE 18GX1X1/2 (RX/OR ONLY) (NEEDLE) ×3 IMPLANT
NS IRRIG 1000ML POUR BTL (IV SOLUTION) ×3 IMPLANT
PACK TOTAL JOINT (CUSTOM PROCEDURE TRAY) ×3 IMPLANT
PACK UNIVERSAL I (CUSTOM PROCEDURE TRAY) IMPLANT
PAD ARMBOARD 7.5X6 YLW CONV (MISCELLANEOUS) ×6 IMPLANT
SET HNDPC FAN SPRY TIP SCT (DISPOSABLE) ×2 IMPLANT
SPONGE LAP 18X18 X RAY DECT (DISPOSABLE) ×6 IMPLANT
STAPLER VISISTAT 35W (STAPLE) ×6 IMPLANT
STOCKINETTE IMPERVIOUS LG (DRAPES) ×3 IMPLANT
STRIP CLOSURE SKIN 1/2X4 (GAUZE/BANDAGES/DRESSINGS) ×4 IMPLANT
SUCTION FRAZIER HANDLE 10FR (MISCELLANEOUS) ×2
SUCTION TUBE FRAZIER 10FR DISP (MISCELLANEOUS) ×1 IMPLANT
SUT DVC VLOC 180 0 12IN GS21 (SUTURE)
SUT MNCRL AB 3-0 PS2 18 (SUTURE) ×6 IMPLANT
SUT VIC AB 0 CT1 27 (SUTURE) ×4
SUT VIC AB 0 CT1 27XBRD ANBCTR (SUTURE) ×2 IMPLANT
SUT VIC AB 2-0 CT1 27 (SUTURE) ×8
SUT VIC AB 2-0 CT1 TAPERPNT 27 (SUTURE) ×4 IMPLANT
SUT VLOC 180 0 24IN GS25 (SUTURE) ×6 IMPLANT
SUTURE DVC VLC 180 0 12IN GS21 (SUTURE) IMPLANT
SYR 50ML SLIP (SYRINGE) ×3 IMPLANT
TOWEL OR 17X24 6PK STRL BLUE (TOWEL DISPOSABLE) ×3 IMPLANT
TOWEL OR 17X26 10 PK STRL BLUE (TOWEL DISPOSABLE) ×3 IMPLANT
TRAY FOLEY CATH 16FRSI W/METER (SET/KITS/TRAYS/PACK) ×3 IMPLANT
UPCHARGE REV TRAY MBT KNEE ×6 IMPLANT
WATER STERILE IRR 1000ML POUR (IV SOLUTION) IMPLANT

## 2015-09-30 NOTE — Anesthesia Preprocedure Evaluation (Addendum)
Anesthesia Evaluation  Patient identified by MRN, date of birth, ID band Patient awake    Reviewed: Allergy & Precautions, NPO status , Patient's Chart, lab work & pertinent test results  History of Anesthesia Complications (+) POST - OP SPINAL HEADACHE and history of anesthetic complications  Airway Mallampati: I  TM Distance: >3 FB Neck ROM: Full    Dental no notable dental hx.    Pulmonary neg pulmonary ROS,    Pulmonary exam normal        Cardiovascular Normal cardiovascular exam     Neuro/Psych negative neurological ROS     GI/Hepatic negative GI ROS, Neg liver ROS,   Endo/Other  negative endocrine ROS  Renal/GU negative Renal ROS     Musculoskeletal  (+) Arthritis ,   Abdominal   Peds  Hematology   Anesthesia Other Findings   Reproductive/Obstetrics                            Anesthesia Physical Anesthesia Plan  ASA: I  Anesthesia Plan: Spinal   Post-op Pain Management:    Induction:   Airway Management Planned: Simple Face Mask  Additional Equipment:   Intra-op Plan:   Post-operative Plan:   Informed Consent:   Plan Discussed with: CRNA  Anesthesia Plan Comments:         Anesthesia Quick Evaluation

## 2015-09-30 NOTE — Transfer of Care (Signed)
Immediate Anesthesia Transfer of Care Note  Patient: Stephani PoliceDebra A Oberman  Procedure(s) Performed: Procedure(s) with comments: TOTAL KNEE BILATERAL (Bilateral) - Autologous blood donor   Patient Location: PACU  Anesthesia Type:Spinal  Level of Consciousness: awake, alert , oriented and patient cooperative  Airway & Oxygen Therapy: Patient Spontanous Breathing and Patient connected to nasal cannula oxygen  Post-op Assessment: Report given to RN, Post -op Vital signs reviewed and stable, Patient moving all extremities X 4 and Patient able to stick tongue midline  Post vital signs: Reviewed and stable  Last Vitals:  Vitals:   09/30/15 0822  Pulse: 80  Resp: 18  Temp: 37.1 C    Last Pain:  Vitals:   09/30/15 0822  TempSrc: Oral         Complications: No apparent anesthesia complications

## 2015-09-30 NOTE — Op Note (Signed)
PREOP DIAGNOSIS: DJD LEFT and RIGHT KNEES POSTOP DIAGNOSIS:  same PROCEDURE: LEFT and RIGHT TKR ANESTHESIA: Spinal and MAC ATTENDING SURGEON: Radwan Cowley G ASSISTANT: Elodia Florence PA  INDICATIONS FOR PROCEDURE: Ashley [name] is a 50 y.o. female who has struggled for a long time with pain due to degenerative arthritis of both knees.  The patient has failed many conservative non-operative measures and at this point has pain which limits the ability to sleep and walk.  The patient is offered bilateral total knee replacement.  Informed operative consent was obtained after discussion of possible risks of anesthesia, infection, neurovascular injury, DVT, and death.  The importance of the post-operative rehabilitation protocol to optimize result was stressed extensively with the patient.  SUMMARY OF FINDINGS AND PROCEDURE:  Ashley [name] was taken to the operative suite where under the above anesthesia a left knee replacement was performed.  There were advanced degenerative changes and the bone quality was good.  We used the DePuyLCS system and placed size standard femur, revision tibia, 35 mm all polyethylene patella, and a size 10 mm spacer.  Elodia Florence PA-C assisted throughout and was invaluable to the completion of the case in that he helped retract and maintain exposure while I placed the components.  He also helped close thereby minimizing OR time.  After completion of the left sided operation we performed an identical procedure on the right using same size components. The patient was admitted for appropriate post-op care to include perioperative antibiotics and mechanical and pharmacologic measures for DVT prophylaxis.  DESCRIPTION OF PROCEDURE:  Ashley [name] was taken to the operative suite where the above anesthesia was applied.  The patient was positioned supine and prepped and draped in normal sterile fashion.  An appropriate time out was performed.  After the administration of kefzol pre-op  antibiotic the leg was elevated and exsanguinated and a tourniquet inflated.  A standard longitudinal incision was made on the anterior knee.  Dissection was carried down to the extensor mechanism.  All appropriate anti-infective measures were used including the pre-operative antibiotic, betadine impregnated drape, and closed hooded exhaust systems for each member of the surgical team.  A medial parapatellar incision was made in the extensor mechanism and the knee cap flipped and the knee flexed.  Some residual meniscal tissues were removed along with any remaining ACL/PCL tissue.  A guide was placed on the tibia and a flat cut was made on it's superior surface.  An intramedullary guide was placed in the femur and was utilized to make anterior and posterior cuts creating an appropriate flexion gap.  A second intramedullary guide was placed in the femur to make a distal cut properly balancing the knee with an extension gap equal to the flexion gap.  The three bones sized to the above mentioned sizes and the appropriate guides were placed and utilized.  A trial reduction was done and the knee easily came to full extension and the patella tracked well on flexion.  The trial components were removed and all bones were cleaned with pulsatile lavage and then dried thoroughly.  Cement was mixed and was pressurized onto the bones followed by placement of the aforementioned components.  Excess cement was trimmed and pressure was held on the components until the cement had hardened.  The tourniquet was deflated and a small amount of bleeding was controlled with cautery and pressure.  The knee was irrigated thoroughly.  The extensor mechanism was re-approximated with V-loc suture in running fashion.  The knee was flexed and the repair was solid.  The subcutaneous tissues were re-approximated with #0 and #2-0 vicryl and the skin closed with a subcuticular stitch and steristrips.  A sterile dressing was applied. Once this case  was completed we performed an identical procedure on the opposite knee using same size components.  Intraoperative fluids, EBL, and tourniquet time can be obtained from anesthesia records.  DISPOSITION:  The patient was taken to recovery room in stable condition and admitted for appropriate post-op care to include peri-operative antibiotic and DVT prophylaxis with mechanical and pharmacologic measures.  Georgean Spainhower G 09/30/2015, 1:22 PM

## 2015-09-30 NOTE — Interval H&P Note (Signed)
History and Physical Interval Note:  09/30/2015 9:16 AM  Ashley [name]  has presented today for surgery, with the diagnosis of BILATERAL KNEE DEGENERATIVE JOINT DISEASE  The various methods of treatment have been discussed with the patient and family. After consideration of risks, benefits and other options for treatment, the patient has consented to  Procedure(s) with comments: TOTAL KNEE BILATERAL (Bilateral) - Autologous blood donor  as a surgical intervention .  The patient's history has been reviewed, patient examined, no change in status, stable for surgery.  I have reviewed the patient's chart and labs.  Questions were answered to the patient's satisfaction.     Jahyra Sukup G

## 2015-09-30 NOTE — Progress Notes (Signed)
Orthopedic Tech Progress Note Patient Details:  Ashley PoliceDebra A [name] [birth year] [id number]  CPM Left Knee CPM Left Knee: On Left Knee Flexion (Degrees): 90 Left Knee Extension (Degrees): 0 Additional Comments: trapeze bar patient helper CPM Right Knee CPM Right Knee: On Right Knee Flexion (Degrees): 90 Right Knee Extension (Degrees): 0 Additional Comments: bilateral Viewed order from doctor's order list  [name], [name] 09/30/2015, 3:07 PM

## 2015-09-30 NOTE — Anesthesia Procedure Notes (Signed)
Spinal  Patient location during procedure: OR Start time: 09/30/2015 10:23 AM End time: 09/30/2015 10:28 AM Staffing Anesthesiologist: Bonita QuinGUIDETTI, Ader Fritze S Performed: anesthesiologist  Preanesthetic Checklist Completed: patient identified, site marked, surgical consent, pre-op evaluation, timeout performed, IV checked, risks and benefits discussed and monitors and equipment checked Spinal Block Patient position: sitting Prep: DuraPrep Patient monitoring: heart rate, cardiac monitor, continuous pulse ox and blood pressure Approach: midline Location: L4-5 Injection technique: single-shot Needle Needle type: Pencil-Tip  Needle gauge: 25 G

## 2015-09-30 NOTE — Interval H&P Note (Signed)
History and Physical Interval Note:  09/30/2015 9:16 AM  Ashley [name]  has presented today for surgery, with the diagnosis of BILATERAL KNEE DEGENERATIVE JOINT DISEASE  The various methods of treatment have been discussed with the patient and family. After consideration of risks, benefits and other options for treatment, the patient has consented to  Procedure(s) with comments: TOTAL KNEE BILATERAL (Bilateral) - Autologous blood donor  as a surgical intervention .  The patient's history has been reviewed, patient examined, no change in status, stable for surgery.  I have reviewed the patient's chart and labs.  Questions were answered to the patient's satisfaction.     Thai Burgueno G   

## 2015-10-01 ENCOUNTER — Encounter (HOSPITAL_COMMUNITY): Payer: Self-pay | Admitting: Orthopaedic Surgery

## 2015-10-01 LAB — BASIC METABOLIC PANEL
ANION GAP: 7 (ref 5–15)
BUN: 12 mg/dL (ref 6–20)
CHLORIDE: 104 mmol/L (ref 101–111)
CO2: 25 mmol/L (ref 22–32)
Calcium: 8.6 mg/dL — ABNORMAL LOW (ref 8.9–10.3)
Creatinine, Ser: 0.81 mg/dL (ref 0.44–1.00)
Glucose, Bld: 143 mg/dL — ABNORMAL HIGH (ref 65–99)
POTASSIUM: 3.9 mmol/L (ref 3.5–5.1)
SODIUM: 136 mmol/L (ref 135–145)

## 2015-10-01 LAB — CBC
HCT: 27.3 % — ABNORMAL LOW (ref 36.0–46.0)
HEMOGLOBIN: 8.9 g/dL — AB (ref 12.0–15.0)
MCH: 30.2 pg (ref 26.0–34.0)
MCHC: 32.6 g/dL (ref 30.0–36.0)
MCV: 92.5 fL (ref 78.0–100.0)
PLATELETS: 232 10*3/uL (ref 150–400)
RBC: 2.95 MIL/uL — AB (ref 3.87–5.11)
RDW: 14.4 % (ref 11.5–15.5)
WBC: 9.5 10*3/uL (ref 4.0–10.5)

## 2015-10-01 NOTE — Progress Notes (Signed)
OT Cancellation Note  Patient Details Name: Ashley [name] MRN: [id number] DOB: 25-Jun-1965   Cancelled Treatment:    Reason Eval/Treat Not Completed: Fatigue/lethargy limiting ability to participate.  Pt just back to bed and in CPMs.  Will try back.   Alaja Goldinger Lansingonarpe, OTR/L [phone]   [name], Ashley [name] 10/01/2015, 1:24 PM

## 2015-10-01 NOTE — Progress Notes (Signed)
Subjective: 1 Day Post-Op Procedure(s) (LRB): TOTAL KNEE BILATERAL (Bilateral)  Activity level:  wbat Diet tolerance:  ok Voiding:  Foley out this morning Patient reports pain as mild and moderate.    Objective: Vital signs in last 24 hours: Temp:  [97.7 F (36.5 C)-99.1 F (37.3 C)] 99.1 F (37.3 C) (09/13 0430) Pulse Rate:  [55-80] 58 (09/13 0430) Resp:  [13-19] 16 (09/13 0430) BP: (103-137)/(53-89) 103/53 (09/13 0430) SpO2:  [95 %-100 %] 95 % (09/13 0430) Weight:  [71.3 kg (157 lb 4 oz)] 71.3 kg (157 lb 4 oz) (09/12 0822)  Labs:  Recent Labs  10/01/15 0504  HGB 8.9*    Recent Labs  10/01/15 0504  WBC 9.5  RBC 2.95*  HCT 27.3*  PLT 232    Recent Labs  10/01/15 0504  NA 136  K 3.9  CL 104  CO2 25  BUN 12  CREATININE 0.81  GLUCOSE 143*  CALCIUM 8.6*   No results for input(s): LABPT, INR in the last 72 hours.  Physical Exam:  Neurologically intact ABD soft Neurovascular intact Sensation intact distally Intact pulses distally Dorsiflexion/Plantar flexion intact Incision: dressing C/D/I and no drainage No cellulitis present Compartment soft  Assessment/Plan:  1 Day Post-Op Procedure(s) (LRB): TOTAL KNEE BILATERAL (Bilateral) Advance diet Up with therapy Discharge home with home health probably tomorrow or Friday depending on how she is feeling. She donated 2 units of blood pre op incase she needed a transfusion. She is currently feeling well and asymptomatic so we will wait on any transfusion. I will change bandages tomorrow to Aquacel. Follow up in office 2 weeks post op. Continue on ASA 325 mg BID x 4 weeks post op.  Deedee Lybarger, Ginger OrganNDREW PAUL 10/01/2015, 7:55 AM

## 2015-10-01 NOTE — Evaluation (Signed)
Physical Therapy Evaluation Patient Details Name: Ashley [name] MRN: [id number] DOB: May 23, 1965 Today's Date: 10/01/2015   History of Present Illness  50 y.o. female now s/p bilateral TKA. PMH: anemia  Clinical Impression  Pt is s/p bilateral TKA resulting in the deficits listed below (see PT Problem List). Pt able to ambulate 10 feet with assistance. Pt will benefit from skilled PT to increase their independence and safety with mobility to allow discharge to home with daughter to assist.       Follow Up Recommendations Home health PT;Supervision for mobility/OOB    Equipment Recommendations  None recommended by PT    Recommendations for Other Services       Precautions / Restrictions Precautions Precautions: Knee;Fall Precaution Booklet Issued: No Precaution Comments: reviewed knee extension precautions Required Braces or Orthoses: Knee Immobilizer - Right;Knee Immobilizer - Left Restrictions Weight Bearing Restrictions: Yes RLE Weight Bearing: Weight bearing as tolerated LLE Weight Bearing: Weight bearing as tolerated      Mobility  Bed Mobility Overal bed mobility: Needs Assistance Bed Mobility: Supine to Sit     Supine to sit: Mod assist     General bed mobility comments: assist provided with LEs.  Transfers Overall transfer level: Needs assistance Equipment used: Rolling walker (2 wheeled) Transfers: Sit to/from Stand Sit to Stand: +2 physical assistance;Mod assist;Min assist;From elevated surface         General transfer comment: cues for hand placement and to walk feet out/back with transfer  Ambulation/Gait Ambulation/Gait assistance: Min assist Ambulation Distance (Feet): 10 Feet Assistive device: Rolling walker (2 wheeled) Gait Pattern/deviations: Step-through pattern Gait velocity: slow pattern   General Gait Details: cues for posture needed and assits with maneuvering rw.   Stairs            Wheelchair Mobility    Modified Rankin  (Stroke Patients Only)       Balance Overall balance assessment: Needs assistance Sitting-balance support: No upper extremity supported Sitting balance-Leahy Scale: Good     Standing balance support: Bilateral upper extremity supported Standing balance-Leahy Scale: Poor Standing balance comment: using rw for standing                             Pertinent Vitals/Pain Pain Assessment: 0-10 Pain Score: 4  Pain Location: bilateral knees, Lt>Rt Pain Descriptors / Indicators: Aching;Tightness Pain Intervention(s): Limited activity within patient's tolerance;Monitored during session    Home Living Family/patient expects to be discharged to:: Private residence Living Arrangements: Children Available Help at Discharge: Family;Available 24 hours/day Type of Home: House Home Access: Stairs to enter Entrance Stairs-Rails: None Entrance Stairs-Number of Steps: 2 Home Layout: Two level;Able to live on main level with bedroom/bathroom Home Equipment: Dan Humphreys - 2 wheels;Bedside commode Additional Comments: adult daughter will be staying with her.     Prior Function Level of Independence: Independent               Hand Dominance        Extremity/Trunk Assessment   Upper Extremity Assessment: Overall WFL for tasks assessed           Lower Extremity Assessment: RLE deficits/detail;LLE deficits/detail RLE Deficits / Details: unable to perform SLR LLE Deficits / Details: unable to perform SLR     Communication   Communication: No difficulties  Cognition Arousal/Alertness: Awake/alert Behavior During Therapy: WFL for tasks assessed/performed Overall Cognitive Status: Within Functional Limits for tasks assessed  General Comments      Exercises        Assessment/Plan    PT Assessment Patient needs continued PT services  PT Diagnosis Difficulty walking   PT Problem List Decreased strength;Decreased range of  motion;Decreased activity tolerance;Decreased balance;Decreased mobility  PT Treatment Interventions DME instruction;Gait training;Stair training;Functional mobility training;Therapeutic activities;Therapeutic exercise;Patient/family education   PT Goals (Current goals can be found in the Care Plan section) Acute Rehab PT Goals Patient Stated Goal: go home from the hospital PT Goal Formulation: With patient Time For Goal Achievement: 10/15/15 Potential to Achieve Goals: Good    Frequency 7X/week   Barriers to discharge        Co-evaluation               End of Session Equipment Utilized During Treatment: Gait belt;Right knee immobilizer;Left knee immobilizer Activity Tolerance: Patient tolerated treatment well Patient left: in chair;with call bell/phone within reach;with family/visitor present Nurse Communication: Mobility status;Weight bearing status         Time: 1010-1034 PT Time Calculation (min) (ACUTE ONLY): 24 min   Charges:   PT Evaluation $PT Eval Moderate Complexity: 1 Procedure PT Treatments $Gait Training: 8-22 mins   PT G Codes:        Christiane HaBenjamin J. Letetia Romanello, PT, CSCS Pager [phone] Office [phone]  10/01/2015, 10:43 AM

## 2015-10-01 NOTE — Progress Notes (Signed)
Physical Therapy Treatment Patient Details Name: Ashley [name] MRN: [id number] DOB: 04-12-1965 Today's Date: 10/01/2015    History of Present Illness 50 y.o. female now s/p bilateral TKA. PMH: anemia    PT Comments    Pt improving with mobility during second PT session. Pt able to ambulate 25 ft with rw and min guard assistance. Anticipate pt will D/C to home following acute stay with HHPT services. PT to continue to follow to progress mobility to improve overall independence and safety.   Follow Up Recommendations  Home health PT;Supervision for mobility/OOB     Equipment Recommendations  None recommended by PT    Recommendations for Other Services       Precautions / Restrictions Precautions Precautions: Knee;Fall Precaution Booklet Issued: Yes (comment) Required Braces or Orthoses: Knee Immobilizer - Right;Knee Immobilizer - Left Restrictions Weight Bearing Restrictions: Yes RLE Weight Bearing: Weight bearing as tolerated LLE Weight Bearing: Weight bearing as tolerated    Mobility  Bed Mobility Overal bed mobility: Needs Assistance Bed Mobility: Supine to Sit     Supine to sit: Min guard;HOB elevated     General bed mobility comments: knee immobilizers on   Transfers Overall transfer level: Needs assistance Equipment used: Rolling walker (2 wheeled) Transfers: Sit to/from Stand Sit to Stand: Mod assist;From elevated surface         General transfer comment: reminder for walking feet back/forward with transfers.   Ambulation/Gait Ambulation/Gait assistance: Min guard Ambulation Distance (Feet): 25 Feet Assistive device: Rolling walker (2 wheeled) Gait Pattern/deviations: Step-through pattern;Decreased step length - right;Decreased step length - left;Trunk flexed Gait velocity: slow pattern   General Gait Details: cues for posture as needed.    Stairs            Wheelchair Mobility    Modified Rankin (Stroke Patients Only)       Balance  Overall balance assessment: Needs assistance Sitting-balance support: No upper extremity supported Sitting balance-Leahy Scale: Good     Standing balance support: Bilateral upper extremity supported Standing balance-Leahy Scale: Poor Standing balance comment: using rw for support                    Cognition Arousal/Alertness: Awake/alert Behavior During Therapy: WFL for tasks assessed/performed Overall Cognitive Status: Within Functional Limits for tasks assessed                      Exercises Total Joint Exercises Ankle Circles/Pumps: AROM;Both;15 reps Quad Sets: Strengthening;Both;10 reps Heel Slides: AAROM;Both;10 reps Straight Leg Raises: Strengthening;Both;10 reps (max assist) Goniometric ROM: knee degrees flexion; Rt 70, Lt 75    General Comments        Pertinent Vitals/Pain Pain Assessment: 0-10 Pain Score: 6  Pain Location: Knees/thighs Pain Descriptors / Indicators: Aching Pain Intervention(s): Limited activity within patient's tolerance;Monitored during session;Ice applied    Home Living                      Prior Function            PT Goals (current goals can now be found in the care plan section) Acute Rehab PT Goals Patient Stated Goal: be active again PT Goal Formulation: With patient Time For Goal Achievement: 10/15/15 Potential to Achieve Goals: Good Progress towards PT goals: Progressing toward goals    Frequency  7X/week    PT Plan Current plan remains appropriate    Co-evaluation  End of Session Equipment Utilized During Treatment: Gait belt;Right knee immobilizer;Left knee immobilizer Activity Tolerance: Patient tolerated treatment well Patient left: in chair;with call bell/phone within reach     Time: 1410-1454 PT Time Calculation (min) (ACUTE ONLY): 44 min  Charges:  $Gait Training: 8-22 mins $Therapeutic Exercise: 23-37 mins                    G Codes:      Ashley [name], PT,  CSCS Pager [phone] Office [phone]  10/01/2015, 3:11 PM

## 2015-10-02 LAB — CBC
HEMATOCRIT: 25.3 % — AB (ref 36.0–46.0)
Hemoglobin: 8.5 g/dL — ABNORMAL LOW (ref 12.0–15.0)
MCH: 30.7 pg (ref 26.0–34.0)
MCHC: 33.6 g/dL (ref 30.0–36.0)
MCV: 91.3 fL (ref 78.0–100.0)
PLATELETS: 214 10*3/uL (ref 150–400)
RBC: 2.77 MIL/uL — AB (ref 3.87–5.11)
RDW: 14.3 % (ref 11.5–15.5)
WBC: 10.1 10*3/uL (ref 4.0–10.5)

## 2015-10-02 MED ORDER — METHOCARBAMOL 500 MG PO TABS: 500.0000 mg | ORAL_TABLET | Freq: Four times a day (QID) | ORAL | 0 refills | Status: DC | PRN

## 2015-10-02 MED ORDER — HYDROCODONE-ACETAMINOPHEN 5-325 MG PO TABS: 1.0000 | ORAL_TABLET | ORAL | 0 refills | Status: DC | PRN

## 2015-10-02 MED ORDER — ASPIRIN 325 MG PO TBEC: 325.0000 mg | DELAYED_RELEASE_TABLET | Freq: Two times a day (BID) | ORAL | 0 refills | Status: DC

## 2015-10-02 NOTE — Evaluation (Signed)
Occupational Therapy Evaluation Patient Details Name: Ashley [name] MRN: [id number] DOB: December 10, 1965 Today's Date: 10/02/2015    History of Present Illness 50 y.o. female now s/p bilateral TKA. PMH: anemia   Clinical Impression   Pt is at set - min A level with ADLs and sup with ADL mobility. Pt will have assist from adult children at home. Pt planning to d/c home today and has DME necessary. All education completed and no further acute OT indicated at this time    Follow Up Recommendations  Home health OT    Equipment Recommendations  Other (comment) (reacher, LH bath sponge)    Recommendations for Other Services       Precautions / Restrictions Precautions Precautions: Knee;Fall Precaution Booklet Issued: Yes (comment) Required Braces or Orthoses: Knee Immobilizer - Right;Knee Immobilizer - Left Restrictions Weight Bearing Restrictions: Yes RLE Weight Bearing: Weight bearing as tolerated LLE Weight Bearing: Weight bearing as tolerated      Mobility Bed Mobility               General bed mobility comments: up in recliner  Transfers Overall transfer level: Needs assistance Equipment used: Rolling walker (2 wheeled) Transfers: Sit to/from Stand Sit to Stand: Supervision         General transfer comment: good safe technique    Balance     Sitting balance-Leahy Scale: Good       Standing balance-Leahy Scale: Poor                              ADL Overall ADL's : Needs assistance/impaired     Grooming: Wash/dry hands;Wash/dry face;Min guard;Standing   Upper Body Bathing: Set up   Lower Body Bathing: Minimal assistance   Upper Body Dressing : Set up   Lower Body Dressing: Minimal assistance   Toilet Transfer: Supervision/safety;Comfort height toilet;Grab bars;RW;Ambulation   Toileting- Clothing Manipulation and Hygiene: Minimal assistance;Sit to/from stand   Tub/ Engineer, structuralhower Transfer: Supervision/safety;3 in 1   Functional mobility  during ADLs: Supervision/safety General ADL Comments: Pt familiar with ADL A/E prior to surgery     Vision  reading glasses, no change from baseline              Pertinent Vitals/Pain Pain Assessment: 0-10 Pain Score: 5  Pain Location: B knees, thighs Pain Descriptors / Indicators: Sore;Aching Pain Intervention(s): Premedicated before session;Monitored during session;Ice applied;Repositioned     Hand Dominance Right   Extremity/Trunk Assessment Upper Extremity Assessment Upper Extremity Assessment: Overall WFL for tasks assessed   Lower Extremity Assessment Lower Extremity Assessment: Defer to PT evaluation   Cervical / Trunk Assessment Cervical / Trunk Assessment: Normal   Communication Communication Communication: No difficulties   Cognition Arousal/Alertness: Awake/alert Behavior During Therapy: WFL for tasks assessed/performed Overall Cognitive Status: Within Functional Limits for tasks assessed                     General Comments   pt pleasant and cooperative                 Home Living Family/patient expects to be discharged to:: Private residence Living Arrangements: Children Available Help at Discharge: Family;Available 24 hours/day Type of Home: House Home Access: Stairs to enter [employer] CorporationEntrance Stairs-Number of Steps: 2   Home Layout: Two level;Able to live on main level with bedroom/bathroom     Bathroom Shower/Tub: Tub/shower unit;Walk-in shower   Bathroom Toilet: Standard     Home  Equipment: Dan Humphreys - 2 wheels;Bedside commode;Tub bench   Additional Comments: adult daughter will be staying with her.       Prior Functioning/Environment Level of Independence: Independent             OT Diagnosis: Acute pain   OT Problem List: Pain;Decreased activity tolerance;Impaired balance (sitting and/or standing);Decreased knowledge of use of DME or AE   OT Treatment/Interventions:      OT Goals(Current goals can be found in the care  plan section) Acute Rehab OT Goals Patient Stated Goal: go hiking, go to aerobics and kick boxing  OT Frequency:     Barriers to D/C:  no barriers                        End of Session Equipment Utilized During Treatment: Gait belt;Left knee immobilizer CPM Left Knee CPM Left Knee: Off CPM Right Knee CPM Right Knee: Off  Activity Tolerance: Patient tolerated treatment well Patient left: in chair   Time: 1003-1019 OT Time Calculation (min): 16 min Charges:  OT General Charges $OT Visit: 1 Procedure OT Evaluation $OT Eval Moderate Complexity: 1 Procedure G-Codes:    Galen Manila 10/02/2015, 1:00 PM

## 2015-10-02 NOTE — Progress Notes (Signed)
Orthopedic Tech Progress Note Patient Details:  Ashley PoliceDebra A [name] 1965-11-01 [id number]  Patient ID: Ashley [name], female   DOB: 1965-11-01, 50 y.o.   MRN: [id number] Applied cpm bi 0-60  Ashley [name], Ashley [name] 10/02/2015, 6:07 AM

## 2015-10-02 NOTE — Care Management Note (Addendum)
Case Management Note  Patient Details  Name: Stephani PoliceDebra A Pollick MRN: [id number] Date of Birth: [birth year]  Subjective/Objective:   Bilateral TKA                 Action/Plan: Discharge Planning: AVS reviewed:  NCM spoke to pt and she has one CPM, RW an 3n1 at home. Contacted [facility] and they will [facility] # [phone] to deliver another CPM today. Pt received RW and 3n1 from [facility]. [facility]/OT and aide. Will fax dc summary, HH orders and facesheet. Contacted pt to make her aware that [facility] will deliver another CPM and provided her with [facility] number. Pt ref # [id number], old ref # [id number].  Expected Discharge Date:  10/02/2015               Expected Discharge Plan:  Home w Home Health Services  In-House Referral:  NA  Discharge planning Services  CM Consult  Post Acute Care Choice:  Home Health Choice offered to:  Patient  DME Arranged:  CPM, 3-N-1, Walker rolling DME [facility], Other - Comment  HH Arranged:  PT, OT, Nurse's Aide HH Agency:  Other - See comment  Status of Service:  Completed, signed off  If discussed at Long Length of Stay Meetings, dates discussed:    Additional Comments:  Elliot CousinShavis, Timica Marcom Ellen, RN 10/02/2015, 2:34 PM

## 2015-10-02 NOTE — Progress Notes (Signed)
Physical Therapy Treatment Patient Details Name: Ashley PoliceDebra A [name] MRN: [id number] DOB: 04-Oct-1965 Today's Date: 10/02/2015    History of Present Illness 50 y.o. female now s/p bilateral TKA. PMH: anemia    PT Comments    Pt progressing well with mobility, she ambulated 100' with RW, completed stair training, and demonstrated understanding of home exercise program. She is ready to DC home from PT standpoint.   Follow Up Recommendations  Home health PT;Supervision for mobility/OOB     Equipment Recommendations  None recommended by PT    Recommendations for Other Services       Precautions / Restrictions Precautions Precautions: Knee;Fall Precaution Booklet Issued: Yes (comment) Required Braces or Orthoses: Knee Immobilizer - Right;Knee Immobilizer - Left Restrictions Weight Bearing Restrictions: Yes RLE Weight Bearing: Weight bearing as tolerated LLE Weight Bearing: Weight bearing as tolerated    Mobility  Bed Mobility               General bed mobility comments: up in recliner  Transfers Overall transfer level: Needs assistance Equipment used: Rolling walker (2 wheeled) Transfers: Sit to/from Stand Sit to Stand: Supervision         General transfer comment: good safe technique  Ambulation/Gait Ambulation/Gait assistance: Supervision Ambulation Distance (Feet): 100 Feet Assistive device: Rolling walker (2 wheeled) Gait Pattern/deviations: Trunk flexed;Decreased step length - left;Decreased step length - right;Step-through pattern Gait velocity: decr   General Gait Details: cues for posture as needed.    Stairs Stairs: Yes Stairs assistance: Min assist Stair Management: No rails;With walker;Step to pattern;Backwards Number of Stairs: 4 General stair comments: verbal cues for sequencing, pt for L KI only, no buckling RLE  Wheelchair Mobility    Modified Rankin (Stroke Patients Only)       Balance     Sitting balance-Leahy Scale: Good        Standing balance-Leahy Scale: Poor                      Cognition Arousal/Alertness: Awake/alert Behavior During Therapy: WFL for tasks assessed/performed Overall Cognitive Status: Within Functional Limits for tasks assessed                      Exercises Total Joint Exercises Ankle Circles/Pumps: AROM;Both;15 reps Quad Sets: Strengthening;Both;10 reps Short Arc QuadBarbaraann Boys: AAROM;Both;10 reps;Supine Heel Slides: AAROM;Both;10 reps Hip ABduction/ADduction: AAROM;Both;10 reps Straight Leg Raises: Strengthening;Both;10 reps (max assist) Knee Flexion: AAROM;Both;5 reps;Seated Goniometric ROM: AAROM R knee 65, L 60    General Comments        Pertinent Vitals/Pain Pain Assessment: No/denies pain Pain Intervention(s): Premedicated before session;Monitored during session;Ice applied    Home Living                      Prior Function            PT Goals (current goals can now be found in the care plan section) Acute Rehab PT Goals Patient Stated Goal: go hiking, go to aerobics and kick boxing PT Goal Formulation: With patient Time For Goal Achievement: 10/15/15 Potential to Achieve Goals: Good Progress towards PT goals: Progressing toward goals    Frequency  7X/week    PT Plan Current plan remains appropriate    Co-evaluation             End of Session Equipment Utilized During Treatment: Gait belt;Left knee immobilizer Activity Tolerance: Patient tolerated treatment well Patient left: in chair;with call bell/phone within reach  Time: 1610-9604 PT Time Calculation (min) (ACUTE ONLY): 56 min  Charges:  $Gait Training: 23-37 mins $Therapeutic Exercise: 23-37 mins                    G Codes:      Tamala Ser 10/02/2015, 12:50 PM [phone]

## 2015-10-02 NOTE — Progress Notes (Signed)
Subjective: 2 Days Post-Op Procedure(s) (LRB): TOTAL KNEE BILATERAL (Bilateral)  Activity level:  wbat Diet tolerance:  ok Voiding:  ok Patient reports pain as mild.    Objective: Vital signs in last 24 hours: Temp:  [98.4 F (36.9 C)-100.8 F (38.2 C)] 100.8 F (38.2 C) (09/14 0413) Pulse Rate:  [69-92] 87 (09/14 0413) Resp:  [16-18] 18 (09/14 0413) BP: (130-142)/(75-85) 130/75 (09/14 0413) SpO2:  [98 %-100 %] 100 % (09/14 0413)  Labs:  Recent Labs  10/01/15 0504 10/02/15 0614  HGB 8.9* 8.5*    Recent Labs  10/01/15 0504 10/02/15 0614  WBC 9.5 10.1  RBC 2.95* 2.77*  HCT 27.3* 25.3*  PLT 232 214    Recent Labs  10/01/15 0504  NA 136  K 3.9  CL 104  CO2 25  BUN 12  CREATININE 0.81  GLUCOSE 143*  CALCIUM 8.6*   No results for input(s): LABPT, INR in the last 72 hours.  Physical Exam:  Neurologically intact ABD soft Neurovascular intact Sensation intact distally Intact pulses distally Dorsiflexion/Plantar flexion intact Incision: dressing C/D/I and no drainage No cellulitis present Compartment soft  Assessment/Plan:  2 Days Post-Op Procedure(s) (LRB): TOTAL KNEE BILATERAL (Bilateral) Advance diet Up with therapy Discharge home with home health Today after PT if cleared and doing well. contineu on ASA 325mg  BID x 4 weeks post op. Follow up in office 2 weeks post op.  I changed dressing to aquacel today.    Mayson Mcneish, Ginger OrganNDREW PAUL 10/02/2015, 7:45 AM

## 2015-10-02 NOTE — Discharge Summary (Signed)
Patient ID: Ashley [name] MRN: [id number] DOB/AGE: 1965-06-20 50 y.o.  Admit date: 09/30/2015 Discharge date: 10/02/2015  Admission Diagnoses:  Principal Problem:   Bilateral primary osteoarthritis of knee   Discharge Diagnoses:  Same  Past Medical History:  Diagnosis Date  . Anemia   . Spinal headache    after c-section  . Wears glasses     Surgeries: Procedure(s): TOTAL KNEE BILATERAL on 09/30/2015   Consultants:   Discharged Condition: Improved  Hospital Course: SHANELE NISSAN is an 50 y.o. female who was admitted 09/30/2015 for operative treatment ofBilateral primary osteoarthritis of knee. Patient has severe unremitting pain that affects sleep, daily activities, and work/hobbies. After pre-op clearance the patient was taken to the operating room on 09/30/2015 and underwent  Procedure(s): TOTAL KNEE BILATERAL.    Patient was given perioperative antibiotics: Anti-infectives    Start     Dose/Rate Route Frequency Ordered Stop   09/30/15 1815  ceFAZolin (ANCEF) IVPB 2g/100 mL premix     2 g 200 mL/hr over 30 Minutes Intravenous Every 6 hours 09/30/15 1802 10/01/15 0614   09/30/15 1000  ceFAZolin (ANCEF) IVPB 2g/100 mL premix     2 g 200 mL/hr over 30 Minutes Intravenous To ShortStay Surgical 09/29/15 1117 09/30/15 1035       Patient was given sequential compression devices, early ambulation, and chemoprophylaxis to prevent DVT.  Patient benefited maximally from hospital stay and there were no complications.    Recent vital signs: Patient Vitals for the past 24 hrs:  BP Temp Temp src Pulse Resp SpO2  10/02/15 0413 130/75 (!) 100.8 F (38.2 C) Oral 87 18 100 %  10/01/15 2043 138/75 98.4 F (36.9 C) Oral 92 16 98 %  10/01/15 1500 (!) 142/85 99.5 F (37.5 C) Oral 69 17 100 %     Recent laboratory studies:  Recent Labs  10/01/15 0504 10/02/15 0614  WBC 9.5 10.1  HGB 8.9* 8.5*  HCT 27.3* 25.3*  PLT 232 214  NA 136  --   K 3.9  --   CL 104  --   CO2 25  --    BUN 12  --   CREATININE 0.81  --   GLUCOSE 143*  --   CALCIUM 8.6*  --      Discharge Medications:     Medication List    STOP taking these medications   ibuprofen 200 MG tablet Commonly known as:  ADVIL,MOTRIN     TAKE these medications   acetaminophen 500 MG tablet Commonly known as:  TYLENOL Take 1,000 mg by mouth every 6 (six) hours as needed (for pain.).   aspirin 325 MG EC tablet Take 1 tablet (325 mg total) by mouth 2 (two) times daily after a meal.   ferrous sulfate 325 (65 FE) MG tablet Take 325 mg by mouth daily.   HYDROcodone-acetaminophen 5-325 MG tablet Commonly known as:  NORCO/VICODIN Take 1-2 tablets by mouth every 4 (four) hours as needed (breakthrough pain).   methocarbamol 500 MG tablet Commonly known as:  ROBAXIN Take 1 tablet (500 mg total) by mouth every 6 (six) hours as needed for muscle spasms.   multivitamin with minerals Tabs tablet Take 1 tablet by mouth daily.   traMADol 50 MG tablet Commonly known as:  ULTRAM Take 50-100 mg by mouth 2 (two) times daily as needed for pain.   vitamin B-12 500 MCG tablet Commonly known as:  CYANOCOBALAMIN Take 500 mcg by mouth daily.   Vitamin D3 5000  units Tabs Take 5,000 Units by mouth daily.       Diagnostic Studies: Dg Chest 2 View  Result Date: 09/19/2015 CLINICAL DATA:  Preoperative total knee replacement EXAM: CHEST  2 VIEW COMPARISON:  None. FINDINGS: Lungs are clear. Heart size and pulmonary vascularity are normal. No adenopathy. No bone lesions. IMPRESSION: No edema or consolidation. Electronically Signed   By: Bretta Bang III M.D.   On: 09/19/2015 11:37    Disposition: 01-Home or Self Care  Discharge Instructions    Call MD / Call 911    Complete by:  As directed    If you experience chest pain or shortness of breath, CALL 911 and be transported to the hospital emergency room.  If you develope a fever above 101 F, pus (white drainage) or increased drainage or redness at the  wound, or calf pain, call your surgeon's office.   Constipation Prevention    Complete by:  As directed    Drink plenty of fluids.  Prune juice may be helpful.  You may use a stool softener, such as Colace (over the counter) 100 mg twice a day.  Use MiraLax (over the counter) for constipation as needed.   Diet - low sodium heart healthy    Complete by:  As directed    Discharge instructions    Complete by:  As directed    INSTRUCTIONS AFTER JOINT REPLACEMENT   Remove items at home which could result in a fall. This includes throw rugs or furniture in walking pathways ICE to the affected joint every three hours while awake for 30 minutes at a time, for at least the first 3-5 days, and then as needed for pain and swelling.  Continue to use ice for pain and swelling. You may notice swelling that will progress down to the foot and ankle.  This is normal after surgery.  Elevate your leg when you are not up walking on it.   Continue to use the breathing machine you got in the hospital (incentive spirometer) which will help keep your temperature down.  It is common for your temperature to cycle up and down following surgery, especially at night when you are not up moving around and exerting yourself.  The breathing machine keeps your lungs expanded and your temperature down.   DIET:  As you were doing prior to hospitalization, we recommend a well-balanced diet.  DRESSING / WOUND CARE / SHOWERING  You may shower 3 days after surgery, but keep the wounds dry during showering.  You may use an occlusive plastic wrap (Press'n Seal for example), NO SOAKING/SUBMERGING IN THE BATHTUB.  If the bandage gets wet, change with a clean dry gauze.  If the incision gets wet, pat the wound dry with a clean towel.  ACTIVITY  Increase activity slowly as tolerated, but follow the weight bearing instructions below.   No driving for 6 weeks or until further direction given by your physician.  You cannot drive while  taking narcotics.  No lifting or carrying greater than 10 lbs. until further directed by your surgeon. Avoid periods of inactivity such as sitting longer than an hour when not asleep. This helps prevent blood clots.  You may return to work once you are authorized by your doctor.     WEIGHT BEARING   Weight bearing as tolerated with assist device (walker, cane, etc) as directed, use it as long as suggested by your surgeon or therapist, typically at least 4-6 weeks.   EXERCISES  Results after joint replacement surgery are often greatly improved when you follow the exercise, range of motion and muscle strengthening exercises prescribed by your doctor. Safety measures are also important to protect the joint from further injury. Any time any of these exercises cause you to have increased pain or swelling, decrease what you are doing until you are comfortable again and then slowly increase them. If you have problems or questions, call your caregiver or physical therapist for advice.   Rehabilitation is important following a joint replacement. After just a few days of immobilization, the muscles of the leg can become weakened and shrink (atrophy).  These exercises are designed to build up the tone and strength of the thigh and leg muscles and to improve motion. Often times heat used for twenty to thirty minutes before working out will loosen up your tissues and help with improving the range of motion but do not use heat for the first two weeks following surgery (sometimes heat can increase post-operative swelling).   These exercises can be done on a training (exercise) mat, on the floor, on a table or on a bed. Use whatever works the best and is most comfortable for you.    Use music or television while you are exercising so that the exercises are a pleasant break in your day. This will make your life better with the exercises acting as a break in your routine that you can look forward to.   Perform all  exercises about fifteen times, three times per day or as directed.  You should exercise both the operative leg and the other leg as well.   Exercises include:   Quad Sets - Tighten up the muscle on the front of the thigh (Quad) and hold for 5-10 seconds.   Straight Leg Raises - With your knee straight (if you were given a brace, keep it on), lift the leg to 60 degrees, hold for 3 seconds, and slowly lower the leg.  Perform this exercise against resistance later as your leg gets stronger.  Leg Slides: Lying on your back, slowly slide your foot toward your buttocks, bending your knee up off the floor (only go as far as is comfortable). Then slowly slide your foot back down until your leg is flat on the floor again.  Angel Wings: Lying on your back spread your legs to the side as far apart as you can without causing discomfort.  Hamstring Strength:  Lying on your back, push your heel against the floor with your leg straight by tightening up the muscles of your buttocks.  Repeat, but this time bend your knee to a comfortable angle, and push your heel against the floor.  You may put a pillow under the heel to make it more comfortable if necessary.   A rehabilitation program following joint replacement surgery can speed recovery and prevent re-injury in the future due to weakened muscles. Contact your doctor or a physical therapist for more information on knee rehabilitation.    CONSTIPATION  Constipation is defined medically as fewer than three stools per week and severe constipation as less than one stool per week.  Even if you have a regular bowel pattern at home, your normal regimen is likely to be disrupted due to multiple reasons following surgery.  Combination of anesthesia, postoperative narcotics, change in appetite and fluid intake all can affect your bowels.   YOU MUST use at least one of the following options; they are listed in order of increasing strength  to get the job done.  They are all  available over the counter, and you may need to use some, POSSIBLY even all of these options:    Drink plenty of fluids (prune juice may be helpful) and high fiber foods Colace 100 mg by mouth twice a day  Senokot for constipation as directed and as needed Dulcolax (bisacodyl), take with full glass of water  Miralax (polyethylene glycol) once or twice a day as needed.  If you have tried all these things and are unable to have a bowel movement in the first 3-4 days after surgery call either your surgeon or your primary doctor.    If you experience loose stools or diarrhea, hold the medications until you stool forms back up.  If your symptoms do not get better within 1 week or if they get worse, check with your doctor.  If you experience "the worst abdominal pain ever" or develop nausea or vomiting, please contact the office immediately for further recommendations for treatment.   ITCHING:  If you experience itching with your medications, try taking only a single pain pill, or even half a pain pill at a time.  You can also use Benadryl over the counter for itching or also to help with sleep.   TED HOSE STOCKINGS:  Use stockings on both legs until for at least 2 weeks or as directed by physician office. They may be removed at night for sleeping.  MEDICATIONS:  See your medication summary on the "After Visit Summary" that nursing will review with you.  You may have some home medications which will be placed on hold until you complete the course of blood thinner medication.  It is important for you to complete the blood thinner medication as prescribed.  PRECAUTIONS:  If you experience chest pain or shortness of breath - call 911 immediately for transfer to the hospital emergency department.   If you develop a fever greater that 101 F, purulent drainage from wound, increased redness or drainage from wound, foul odor from the wound/dressing, or calf pain - CONTACT YOUR SURGEON.                                                    FOLLOW-UP APPOINTMENTS:  If you do not already have a post-op appointment, please call the office for an appointment to be seen by your surgeon.  Guidelines for how soon to be seen are listed in your "After Visit Summary", but are typically between 1-4 weeks after surgery.  OTHER INSTRUCTIONS:   Knee Replacement:  Do not place pillow under knee, focus on keeping the knee straight while resting. CPM instructions: 0-90 degrees, 2 hours in the morning, 2 hours in the afternoon, and 2 hours in the evening. Place foam block, curve side up under heel at all times except when in CPM or when walking.  DO NOT modify, tear, cut, or change the foam block in any way.  MAKE SURE YOU:  Understand these instructions.  Get help right away if you are not doing well or get worse.    Thank you for letting us be a part of your medical care team.  It is a privilege we respect greatly.  We hope these instructions will help you stay on track for a fast and full recovery!   Increase activity slowly as  tolerated    Complete by:  As directed       Follow-up Information    DALLDORF,PETER G, MD. Schedule an appointment as soon as possible for a visit in 2 weeks.   Specialty:  Orthopedic Surgery Contact information: [address] [place] [address] [phone]            Signed: Drema Halon 10/02/2015, 7:50 AM

## 2015-10-03 LAB — TYPE AND SCREEN
ABO/RH(D): O NEG
Antibody Screen: NEGATIVE
UNIT DIVISION: 0
Unit division: 0

## 2015-10-03 NOTE — Progress Notes (Signed)
Received call from [facility]. And HH arranged with [facility] # [phone]. Isidoro DonningAlesia Idelle Reimann RN CCM Case Mgmt phone [phone]

## 2015-10-07 NOTE — Anesthesia Postprocedure Evaluation (Signed)
Anesthesia Post Note  Patient: Stephani PoliceDebra A Santalucia  Procedure(s) Performed: Procedure(s) (LRB): TOTAL KNEE BILATERAL (Bilateral)  Patient location during evaluation: PACU Anesthesia Type: Spinal Level of consciousness: oriented and awake and alert Pain management: pain level controlled Vital Signs Assessment: post-procedure vital signs reviewed and stable Respiratory status: spontaneous breathing, respiratory function stable and patient connected to nasal cannula oxygen Cardiovascular status: blood pressure returned to baseline and stable Postop Assessment: no headache and no backache Anesthetic complications: no    Last Vitals:  Vitals:   10/01/15 2043 10/02/15 0413  BP: 138/75 130/75  Pulse: 92 87  Resp: 16 18  Temp: 36.9 C (!) 38.2 C    Last Pain:  Vitals:   10/02/15 0845  TempSrc:   PainSc: 0-No pain                 Bonita Quinichard S Demita Tobia

## 2019-05-28 ENCOUNTER — Other Ambulatory Visit: Payer: Self-pay | Admitting: Orthopaedic Surgery

## 2019-05-28 DIAGNOSIS — M25561 Pain in right knee: Secondary | ICD-10-CM

## 2019-05-29 ENCOUNTER — Ambulatory Visit
Admission: RE | Admit: 2019-05-29 | Discharge: 2019-05-29 | Disposition: A | Discharge status: Home / Self Care | Payer: Managed Care, Other (non HMO) | Source: Ambulatory Visit | Attending: Orthopaedic Surgery | Admitting: Orthopaedic Surgery

## 2019-05-29 ENCOUNTER — Other Ambulatory Visit: Payer: Self-pay

## 2019-05-29 ENCOUNTER — Encounter: Payer: Self-pay | Admitting: *Deleted

## 2019-05-29 DIAGNOSIS — M25562 Pain in left knee: Secondary | ICD-10-CM

## 2019-05-29 HISTORY — PX: IR RADIOLOGIST EVAL & MGMT: IMG5224

## 2019-05-29 NOTE — Consult Note (Signed)
Chief Complaint: Knee pain  Referring Physician(s): Dalldorf,Peter  History of Present Illness: Ashley [name] is a 54 y.o. female with no significant past medical history who has a long history of bilateral knee pain ultimately undergoing bilateral total knee replacements in 2017.  While the patient states that her overall knee pain is improved since undergoing the knee replacements, she has persistent bilateral, right greater than left, knee pain which is worse with activity.  She admits to persistent decreased range of motion in both of her knees though denies swelling.  Patient has resorted to post knee replacement cortisone injections in the past and wishes to avoid oral pain medicines.  Patient currently rates her knee pain as a 6-7 out of 10 with activity, improving to 2-3 out of 10 with prolonged rest.  The patient states she is slightly overweight and is frustrated as she is unable to consistently exercise due to her knee pain.   Past Medical History:  Diagnosis Date  . Anemia   . Spinal headache    after c-section  . Wears glasses     Past Surgical History:  Procedure Laterality Date  . CESAREAN SECTION  1992  . COLONOSCOPY    . KNEE ARTHROSCOPY Right    x2  . TOTAL KNEE ARTHROPLASTY Bilateral 09/30/2015   Procedure: TOTAL KNEE BILATERAL;  Surgeon: Marcene Corning, MD;  Location: [facility] OR;  Service: Orthopedics;  Laterality: Bilateral;  Autologous blood donor     Allergies: No known allergies  Medications: Prior to Admission medications   Medication Sig Start Date End Date Taking? Authorizing Provider  acetaminophen (TYLENOL) 500 MG tablet Take 1,000 mg by mouth every 6 (six) hours as needed (for pain.).    [provider]  aspirin EC 325 MG EC tablet Take 1 tablet (325 mg total) by mouth 2 (two) times daily after a meal. 10/02/15   Elodia Florence, PA-C  Cholecalciferol (VITAMIN D3) 5000 units TABS Take 5,000 Units by mouth daily.    [provider]  ferrous sulfate 325 (65 FE) MG tablet Take 325 mg by mouth daily.    [provider]  HYDROcodone-acetaminophen (NORCO/VICODIN) 5-325 MG tablet Take 1-2 tablets by mouth every 4 (four) hours as needed (breakthrough pain). 10/02/15   Elodia Florence, PA-C  methocarbamol (ROBAXIN) 500 MG tablet Take 1 tablet (500 mg total) by mouth every 6 (six) hours as needed for muscle spasms. 10/02/15   Elodia Florence, PA-C  Multiple Vitamin (MULTIVITAMIN WITH MINERALS) TABS tablet Take 1 tablet by mouth daily.    [provider]  traMADol (ULTRAM) 50 MG tablet Take 50-100 mg by mouth 2 (two) times daily as needed for pain. 06/20/15   [provider]  vitamin B-12 (CYANOCOBALAMIN) 500 MCG tablet Take 500 mcg by mouth daily.    [provider]     No family history on file.  Social History   Socioeconomic History  . Marital status: Married    Spouse name: Not on file  . Number of children: Not on file  . Years of education: Not on file  . Highest education level: Not on file  Occupational History  . Not on file  Tobacco Use  . Smoking status: Never Smoker  . Smokeless tobacco: Never Used  Substance and Sexual Activity  . Alcohol use: Yes    Comment: occasionally  . Drug use: No  . Sexual activity: Not on file  Other Topics Concern  . Not on file  Social  History Narrative  . Not on file   Social Determinants of Health   Financial Resource Strain:   . Difficulty of Paying Living Expenses:   Food Insecurity:   . Worried About Programme researcher, broadcasting/film/video in the Last Year:   . Barista in the Last Year:   Transportation Needs:   . Freight forwarder (Medical):   Marland Kitchen Lack of Transportation (Non-Medical):   Physical Activity:   . Days of Exercise per Week:   . Minutes of Exercise per Session:   Stress:   . Feeling of Stress :   Social Connections:   . Frequency of Communication with Friends and Family:   . Frequency of Social Gatherings with  Friends and Family:   . Attends Religious Services:   . Active Member of Clubs or Organizations:   . Attends Banker Meetings:   Marland Kitchen Marital Status:     ECOG Status: 1 - Symptomatic but completely ambulatory  Review of Systems  Review of Systems: A 12 point ROS discussed and pertinent positives are indicated in the HPI above.  All other systems are negative.  Physical Exam No direct physical exam was performed (except for noted visual exam findings with Video Visits).   Vital Signs: There were no vitals taken for this visit.  Imaging: No results found.  Labs:  CBC: No results for input(s): WBC, HGB, HCT, PLT in the last 8760 hours.  COAGS: No results for input(s): INR, APTT in the last 8760 hours.  BMP: No results for input(s): NA, K, CL, CO2, GLUCOSE, BUN, CALCIUM, CREATININE, GFRNONAA, GFRAA in the last 8760 hours.  Invalid input(s): CMP  LIVER FUNCTION TESTS: No results for input(s): BILITOT, AST, ALT, ALKPHOS, PROT, ALBUMIN in the last 8760 hours.  TUMOR MARKERS: No results for input(s): AFPTM, CEA, CA199, CHROMGRNA in the last 8760 hours.  Assessment and Plan:  Ashley [name] is a 54 y.o. female with no significant past medical history who has a long history of bilateral knee pain ultimately undergoing bilateral total knee replacements in 2017.  While the patient states that her overall knee pain is improved since undergoing the knee replacement she has persistent right greater than left knee pain which is worse with activity.    Patient currently rates her knee pain as a 6-7 out of 10 with activity, improving to 2-3 out of 10 with prolonged rest.  The patient states she is slightly overweight and is frustrated as she is unable to consistently exercise due to her knee pain.  I explained that the IOVERA treatment is a new modality that utilizes cyroneurolysis technology to temporarily alter the pain recepting nerves supplying the anterior aspect of  the knee, in hopes of achieving a clinically significant reduction in knee pain.     I explained that if successful, the patient will experience a rapid pain reduction which can last for approximately 3 months.  I explained that while their knee pain may be reduced, the structural damage of the knee remains, and thus, this is not a curative technology and their knee pain will return.     Risks associated with the procedure include bleeding/bruising, infection and post procedural paresthesias/weakness.   The procedure is performed as an outpatient basis at [facility].  The procedure is performed soley with local anesthesia, and therefore the patient does need to be NPO, there is no postprocedural recovery and the patient does not need a driver home.  Additionally, the patient  does not have to hold any other their medications, including anticoagulation.  On the day of the procedure, the patient is encouraged to wear either shorts or loose fitting pants that can be rolled up to the upper thigh.     Following this prolonged and detailed conversation, the patient wishes to pursue IOVERA therapy of the right knee.    As such, pending insurance approval, this procedure would be scheduled at [facility] long hospital at the next earliest convenience.  The patient knows to call the interventional radiology clinic with any interval questions or concerns   Thank you for this interesting consult.  I greatly enjoyed meeting Ashley [name] and look forward to participating in their care.  A copy of this report was sent to the requesting provider on this date.  Electronically Signed: Sandi Mariscal 05/29/2019, 10:00 AM   I spent a total of 15 Minutes in remote  clinical consultation, greater than 50% of which was counseling/coordinating care for knee pain.    Visit type: Audio only (telephone). Audio (no video) only due to patient's lack of internet/smartphone capability. Alternative for in-person  consultation at [facility], [address] [place], [place], [place]. This visit type was conducted due to national recommendations for restrictions regarding the COVID-19 Pandemic (e.g. social distancing).  This format is felt to be most appropriate for this patient at this time.  All issues noted in this document were discussed and addressed.

## 2019-05-30 ENCOUNTER — Other Ambulatory Visit (HOSPITAL_COMMUNITY): Payer: Self-pay | Admitting: Interventional Radiology

## 2019-05-30 DIAGNOSIS — M25561 Pain in right knee: Secondary | ICD-10-CM

## 2019-06-06 ENCOUNTER — Other Ambulatory Visit: Payer: Self-pay

## 2019-06-06 ENCOUNTER — Ambulatory Visit (HOSPITAL_COMMUNITY)
Admission: RE | Admit: 2019-06-06 | Discharge: 2019-06-06 | Disposition: A | Discharge status: Home / Self Care | Payer: Managed Care, Other (non HMO) | Source: Ambulatory Visit | Attending: Interventional Radiology | Admitting: Interventional Radiology

## 2019-06-06 DIAGNOSIS — M25561 Pain in right knee: Secondary | ICD-10-CM | POA: Insufficient documentation

## 2019-06-06 HISTORY — PX: IR ABLATE LIVER CRYOABLATION: IMG5524

## 2019-06-06 MED ORDER — LIDOCAINE-EPINEPHRINE (PF) 2 %-1:200000 IJ SOLN
INTRAMUSCULAR | Status: AC
  Filled 2019-06-06: qty 20

## 2019-07-06 ENCOUNTER — Telehealth: Payer: Self-pay | Admitting: *Deleted

## 2019-07-06 NOTE — Telephone Encounter (Signed)
I called Ms. Kooyman to inquire if she is still doing good from [facility] Therapy of Rt Knee and to see if she is ready to proceed with left. Patient states her knee is doing great the problem she is having, her insurance quoted her $50 for procedure. She has gotten a bill for 1700.00, she has called her insurance and they said that was for supplies used during procedure. She has tried [employer] office but they haven't gotten the info back as of yet. Per Ms. Goodnow she is pleased with her right knee but does not wish to pursue the left. I advised to call if needed or decided to have lft knee therapy.Pearla Dubonnet

## 2020-03-31 ENCOUNTER — Encounter: Payer: Self-pay | Admitting: Gastroenterology

## 2020-04-25 ENCOUNTER — Telehealth: Payer: Self-pay | Admitting: Gastroenterology

## 2020-04-28 NOTE — Telephone Encounter (Signed)
Can you pl print last colon for her RG

## 2020-04-29 NOTE — Telephone Encounter (Signed)
LVM. Patient can scheudle her colonoscopy at [facility] and her previsit with nurse. Patient's last colonoscopy was 12-04-2014 and was given a 5 year repeat

## 2020-04-29 NOTE — Telephone Encounter (Signed)
Can we make OV with me RG

## 2020-04-29 NOTE — Telephone Encounter (Signed)
Patient said that she wants medical reason as to why she needs to do her colonoscopy done although her report from 12-04-2014 says that current guidelines at that times says to repeat in 5 years.  She said she didn't have any colon polyps and with her diverticulosis she hasn't had any problems and only her and her father have diverticulosis in the family. No polyps or colon cancer in the family. Dr Chales Abrahams please advise

## 2020-04-30 NOTE — Telephone Encounter (Signed)
No problems Put her on recall for 5yr from last colonoscopy and let her know Also, please let her know that if she has any GI problems until then, she should get in touch with [place] right away  RG

## 2020-04-30 NOTE — Telephone Encounter (Signed)
Called patient and asked her if she is willing to make an office visit to discuss her seeing about repeating a colonoscopy and patient got upset with me saying that she will not pay 50 dollars to come in when there isn't a medical reason as to why she needed to come in and that that she will take her chances and wait her 10 years instead.

## 2020-05-01 NOTE — Telephone Encounter (Signed)
Recall set for 11-2024 for colonoscopy

## 2020-11-13 ENCOUNTER — Encounter: Payer: Self-pay | Admitting: Gastroenterology

## 2020-12-18 ENCOUNTER — Ambulatory Visit: Payer: Managed Care, Other (non HMO) | Admitting: Gastroenterology

## 2021-01-07 ENCOUNTER — Ambulatory Visit (INDEPENDENT_AMBULATORY_CARE_PROVIDER_SITE_OTHER): Payer: Managed Care, Other (non HMO) | Admitting: Gastroenterology

## 2021-01-07 ENCOUNTER — Other Ambulatory Visit: Payer: Self-pay

## 2021-01-07 ENCOUNTER — Encounter: Payer: Self-pay | Admitting: Gastroenterology

## 2021-01-07 VITALS — BP 122/82 | HR 80 | Ht 65.0 in | Wt 170.2 lb

## 2021-01-07 DIAGNOSIS — K581 Irritable bowel syndrome with constipation: Secondary | ICD-10-CM | POA: Diagnosis not present

## 2021-01-07 DIAGNOSIS — Z1211 Encounter for screening for malignant neoplasm of colon: Secondary | ICD-10-CM | POA: Diagnosis not present

## 2021-01-07 DIAGNOSIS — Z1212 Encounter for screening for malignant neoplasm of rectum: Secondary | ICD-10-CM

## 2021-01-07 NOTE — Patient Instructions (Signed)
If you are age 56 or older, your body mass index should be between 23-30. Your Body mass index is 28.33 kg/m. If this is out of the aforementioned range listed, please consider follow up with your Primary Care Provider.  If you are age 58 or younger, your body mass index should be between 19-25. Your Body mass index is 28.33 kg/m. If this is out of the aformentioned range listed, please consider follow up with your Primary Care Provider.   __________________________________________________________  The [facility] providers would like to encourage you to use [facility] to communicate with providers for non-urgent requests or questions.  Due to long hold times on the telephone, sending your provider a message by [facility] may be a faster and more efficient way to get a response.  Please allow 48 business hours for a response.  Please remember that this is for non-urgent requests.   Due to recent changes in healthcare laws, you may see the results of your imaging and laboratory studies on MyChart before your provider has had a chance to review them.  We understand that in some cases there may be results that are confusing or concerning to you. Not all laboratory results come back in the same time frame and the provider may be waiting for multiple results in order to interpret others.  Please give [place] 48 hours in order for your provider to thoroughly review all the results before contacting the office for clarification of your results.   Take Miralax 1 capful mixed in 8 ounces of water twice daily until you have a large bowel movement. Then start taking Benefiber 1 tablespoon at bedtime.  You have been given Clenpiq to use for your colonoscpopy.  It was a pleasure to see you today!  Lynann Bologna, M.D.

## 2021-01-07 NOTE — Progress Notes (Signed)
Chief Complaint: For colonoscopy  Referring Provider:  Ronita Hipps, MD      ASSESSMENT AND PLAN;   #1. Colorectal cancer screeing  #2. IBS-C   Plan: -Miralax 17g po BID until large BM, then Benefiber 1 TBS QD with 8 ounces of water -Colon with Clenpik for further eval -If still with problems, would consider CT Abdo/pelvis at a later date. -Minimize nonsteroidals including Mobic    Discussed risks & benefits of colonoscopy. Risks including rare perforation req laparotomy, bleeding after bx/polypectomy req blood transfusion, rarely missing neoplasms, risks of anesthesia/sedation, rare risk of damage to internal organs. Benefits outweigh the risks. Patient agrees to proceed. All the questions were answered. Pt consents to proceed.    HPI:    KATURA GOMBERT is a 55 y.o. female  With DJD, B/L knee replacements, LSCS  With lower abdominal pain x few years, worse over last 2 to 3 weeks associated with abdominal bloating and constipation.  She will occasionally have diarrhea especially after eating pizzas.  The abdominal pain does get better with defecation.  No melena or hematochezia.  No weight loss.  No fever chills or night sweats.  No jaundice dark urine or pale stools.  She does take Mobic and baclofen for back pain.  Denies having any nausea, vomiting, heartburn, regurgitation, odynophagia or dysphagia.  Denies having any history suggestive of lactose or gluten intolerance.  Drinks plenty of water.  Previous GI work-up Colonoscopy 11/2014 ([facility])-moderate predominantly sigmoid diverticulosis.  Otherwise normal colonoscopy.  The colon was highly redundant.  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  Divorced, 1 daughter (she is a patient of ours as well) Past Medical History:  Diagnosis Date   Anemia    Diverticulosis    Dyspareunia in female    History of use of contraceptive intrauterine device (IUD)    Spinal headache    after c-section   Wears  glasses     Past Surgical History:  Procedure Laterality Date   CESAREAN SECTION  1992   COLONOSCOPY  12/04/2014   Moderate predominantly sigmoid diverticulosis. Otherwise normal colonoscopy. The colon was highly redundant   IR ABLATE LIVER CRYOABLATION  06/06/2019   IR RADIOLOGIST EVAL & MGMT  05/29/2019   KNEE ARTHROSCOPY Right    x2   Low grade squamous intraepithelial dysplasia     TOTAL KNEE ARTHROPLASTY Bilateral 09/30/2015   Procedure: TOTAL KNEE BILATERAL;  Surgeon: Melrose Nakayama, MD;  Location: [facility];  Service: Orthopedics;  Laterality: Bilateral;  Autologous blood donor     Family History  Problem Relation Age of Onset   Breast cancer Mother    Diverticulosis Father    Ulcerative colitis Daughter    Kidney disease Daughter    Colon cancer Neg Hx    Esophageal cancer Neg Hx     Social History   Tobacco Use   Smoking status: Never   Smokeless tobacco: Never  Vaping Use   Vaping Use: Never used  Substance Use Topics   Alcohol use: Yes    Comment: occasionally   Drug use: No    Current Outpatient Medications  Medication Sig Dispense Refill   acetaminophen (TYLENOL) 500 MG tablet Take 1,000 mg by mouth every 6 (six) hours as needed (for pain.).     baclofen (LIORESAL) 10 MG tablet Take 10 mg by mouth as needed.     Cholecalciferol (VITAMIN D3) 5000 units TABS Take 5,000 Units by mouth daily.  meloxicam (MOBIC) 15 MG tablet Take 15 mg by mouth as needed.     methocarbamol (ROBAXIN) 500 MG tablet Take 1 tablet (500 mg total) by mouth every 6 (six) hours as needed for muscle spasms. 40 tablet 0   Multiple Vitamin (MULTIVITAMIN WITH MINERALS) TABS tablet Take 1 tablet by mouth daily.     pregabalin (LYRICA) 75 MG capsule Take 75 mg by mouth at bedtime.     topiramate (TOPAMAX) 50 MG tablet Take 50 mg by mouth 2 (two) times daily.     traMADol (ULTRAM) 50 MG tablet Take 50-100 mg by mouth 2 (two) times daily as needed for pain.  0   vitamin B-12 (CYANOCOBALAMIN)  500 MCG tablet Take 500 mcg by mouth daily.     No current facility-administered medications for this visit.    Allergies  Allergen Reactions   No Known Allergies     Review of Systems:  Constitutional: Denies fever, chills, diaphoresis, appetite change and fatigue.  HEENT: Has allergies Respiratory: Denies SOB, DOE, cough, chest tightness,  and wheezing.   Cardiovascular: Denies chest pain, palpitations and leg swelling.  Genitourinary: Denies dysuria, urgency, frequency, hematuria, flank pain and difficulty urinating.  Musculoskeletal: Denies myalgias, has back pain, no joint swelling, arthralgias and gait problem.  Skin: No rash.  Neurological: Denies dizziness, seizures, syncope, weakness, light-headedness, numbness and headaches.  Hematological: Denies adenopathy. Easy bruising, personal or family bleeding history  Psychiatric/Behavioral: Has situational anxiety, no depression     Physical Exam:    BP 122/82    Pulse 80    Ht 5\' 5"  (1.651 m)    Wt 170 lb 4 oz (77.2 kg)    BMI 28.33 kg/m  Wt Readings from Last 3 Encounters:  01/07/21 170 lb 4 oz (77.2 kg)  09/30/15 157 lb 4 oz (71.3 kg)  09/19/15 157 lb 4 oz (71.3 kg)   Constitutional:  Well-developed, in no acute distress. Psychiatric: Normal mood and affect. Behavior is normal. HEENT: Pupils normal.  Conjunctivae are normal. No scleral icterus. Cardiovascular: Normal rate, regular rhythm. No edema Pulmonary/chest: Effort normal and breath sounds normal. No wheezing, rales or rhonchi. Abdominal: Soft, nondistended. Nontender. Bowel sounds active throughout. There are no masses palpable. No hepatomegaly. Rectal: Deferred Neurological: Alert and oriented to person place and time. Skin: Skin is warm and dry. No rashes noted.       11/19/15, MD 01/07/2021, 9:23 AM  Cc: 01/09/2021, MD

## 2021-02-27 ENCOUNTER — Encounter: Payer: Self-pay | Admitting: Gastroenterology

## 2021-02-27 ENCOUNTER — Ambulatory Visit (AMBULATORY_SURGERY_CENTER): Payer: Managed Care, Other (non HMO) | Admitting: Gastroenterology

## 2021-02-27 VITALS — BP 141/82 | HR 63 | Temp 97.8°F | Resp 16 | Ht 65.0 in | Wt 170.0 lb

## 2021-02-27 DIAGNOSIS — Z1211 Encounter for screening for malignant neoplasm of colon: Secondary | ICD-10-CM

## 2021-02-27 DIAGNOSIS — Z1212 Encounter for screening for malignant neoplasm of rectum: Secondary | ICD-10-CM | POA: Diagnosis not present

## 2021-02-27 MED ORDER — SODIUM CHLORIDE 0.9 % IV SOLN: 500.0000 mL | Freq: Once | INTRAVENOUS | Status: DC

## 2021-02-27 NOTE — Progress Notes (Signed)
VS by DT    

## 2021-02-27 NOTE — Op Note (Signed)
[facility] Patient Name: Ashley HallmarkDebra Chaney Procedure Date: 02/27/2021 2:57 PM MRN: [id number] Endoscopist: Lynann Bolognaajesh Bonnie Overdorf , MD Age: [age] Referring MD:  Date of Birth: 1965-07-26 Gender: Female Account #: [id number] Procedure:                Colonoscopy Indications:              Screening for colorectal malignant neoplasm Medicines:                Monitored Anesthesia Care Procedure:                Pre-Anesthesia Assessment:                           - Prior to the procedure, a History and Physical                            was performed, and patient medications and                            allergies were reviewed. The patient's tolerance of                            previous anesthesia was also reviewed. The risks                            and benefits of the procedure and the sedation                            options and risks were discussed with the patient.                            All questions were answered, and informed consent                            was obtained. Prior Anticoagulants: The patient has                            taken no previous anticoagulant or antiplatelet                            agents. ASA Grade Assessment: II - A patient with                            mild systemic disease. After reviewing the risks                            and benefits, the patient was deemed in                            satisfactory condition to undergo the procedure.                           After obtaining informed consent, the colonoscope  was passed under direct vision. Throughout the                            procedure, the patient's blood pressure, pulse, and                            oxygen saturations were monitored continuously. The                            Olympus PCF-H190DL (#[id number]) Colonoscope was                            introduced through the anus and advanced to the 2                            cm into the ileum. The  colonoscopy was performed                            without difficulty. The patient tolerated the                            procedure well. The quality of the bowel                            preparation was good. The terminal ileum, ileocecal                            valve, appendiceal orifice, and rectum were                            photographed. Scope In: 3:09:24 PM Scope Out: 3:22:41 PM Scope Withdrawal Time: 0 hours 7 minutes 56 seconds  Total Procedure Duration: 0 hours 13 minutes 17 seconds  Findings:                 Multiple medium-mouthed diverticula were found in                            the sigmoid colon, few in descending colon and                            ascending colon. Some luminal narrowing in the                            sigmoid colon c/w muscular hypertrophy. Few                            diverticuli with stool impacted.                           Non-bleeding internal hemorrhoids were found during                            retroflexion. The hemorrhoids were small and Grade  I (internal hemorrhoids that do not prolapse).                           The terminal ileum appeared normal.                           The exam was otherwise without abnormality on                            direct and retroflexion views. Complications:            No immediate complications. Estimated Blood Loss:     Estimated blood loss: none. Impression:               - Moderate predominantly sigmoid diverticulosis                           - Non-bleeding internal hemorrhoids.                           - The examined portion of the ileum was normal.                           - The examination was otherwise normal on direct                            and retroflexion views.                           - No specimens collected. Recommendation:           - Patient has a contact number available for                            emergencies. The signs and symptoms  of potential                            delayed complications were discussed with the                            patient. Return to normal activities tomorrow.                            Written discharge instructions were provided to the                            patient.                           - High fiber diet.                           - Continue present medications.                           - Repeat colonoscopy in 10 years for screening  purposes. Earlier, if with any new problems or                            change in family history.                           - The findings and recommendations were discussed                            with the patient's family. Lynann Bologna, MD 02/27/2021 3:25:53 PM This report has been signed electronically.

## 2021-02-27 NOTE — Patient Instructions (Signed)
Read all handouts given to you today  High fiber diet recommended  Drink plenty of water   YOU HAD AN ENDOSCOPIC PROCEDURE TODAY AT THE [facility]:   Refer to the procedure report that was given to you for any specific questions about what was found during the examination.  If the procedure report does not answer your questions, please call your gastroenterologist to clarify.  If you requested that your care partner not be given the details of your procedure findings, then the procedure report has been included in a sealed envelope for you to review at your convenience later.  YOU SHOULD EXPECT: Some feelings of bloating in the abdomen. Passage of more gas than usual.  Walking can help get rid of the air that was put into your GI tract during the procedure and reduce the bloating. If you had a lower endoscopy (such as a colonoscopy or flexible sigmoidoscopy) you may notice spotting of blood in your stool or on the toilet paper. If you underwent a bowel prep for your procedure, you may not have a normal bowel movement for a few days.  Please Note:  You might notice some irritation and congestion in your nose or some drainage.  This is from the oxygen used during your procedure.  There is no need for concern and it should clear up in a day or so.  SYMPTOMS TO REPORT IMMEDIATELY:  Following lower endoscopy (colonoscopy or flexible sigmoidoscopy):  Excessive amounts of blood in the stool  Significant tenderness or worsening of abdominal pains  Swelling of the abdomen that is new, acute  Fever of 100F or higher  For urgent or emergent issues, a gastroenterologist can be reached at any hour by calling [phone]. Do not use MyChart messaging for urgent concerns.    DIET:  We do recommend a small meal at first, but then you may proceed to your regular diet.  Drink plenty of fluids but you should avoid alcoholic beverages for 24 hours.  ACTIVITY:  You should plan to take it easy  for the rest of today and you should NOT DRIVE or use heavy machinery until tomorrow (because of the sedation medicines used during the test).    FOLLOW UP: Our staff will call the number listed on your records 48-72 hours following your procedure to check on you and address any questions or concerns that you may have regarding the information given to you following your procedure. If we do not reach you, we will leave a message.  We will attempt to reach you two times.  During this call, we will ask if you have developed any symptoms of COVID 19. If you develop any symptoms (ie: fever, flu-like symptoms, shortness of breath, cough etc.) before then, please call [phone].  If you test positive for Covid 19 in the 2 weeks post procedure, please call and report this information to [place].     SIGNATURES/CONFIDENTIALITY: You and/or your care partner have signed paperwork which will be entered into your electronic medical record.  These signatures attest to the fact that that the information above on your After Visit Summary has been reviewed and is understood.  Full responsibility of the confidentiality of this discharge information lies with you and/or your care-partner.

## 2021-02-27 NOTE — Progress Notes (Signed)
Chief Complaint: For colonoscopy  Referring Provider:  Marylen Ponto, MD      ASSESSMENT AND PLAN;   #1. Colorectal cancer screeing  #2. IBS-C   Plan: -Miralax 17g po BID until large BM, then Benefiber 1 TBS QD with 8 ounces of water -Colon with Clenpik for further eval -If still with problems, would consider CT Abdo/pelvis at a later date. -Minimize nonsteroidals including Mobic    Discussed risks & benefits of colonoscopy. Risks including rare perforation req laparotomy, bleeding after bx/polypectomy req blood transfusion, rarely missing neoplasms, risks of anesthesia/sedation, rare risk of damage to internal organs. Benefits outweigh the risks. Patient agrees to proceed. All the questions were answered. Pt consents to proceed.    HPI:    Ashley [name] is a 56 y.o. female  With DJD, B/L knee replacements, LSCS  With lower abdominal pain x few years, worse over last 2 to 3 weeks associated with abdominal bloating and constipation.  She will occasionally have diarrhea especially after eating pizzas.  The abdominal pain does get better with defecation.  No melena or hematochezia.  No weight loss.  No fever chills or night sweats.  No jaundice dark urine or pale stools.  She does take Mobic and baclofen for back pain.  Denies having any nausea, vomiting, heartburn, regurgitation, odynophagia or dysphagia.  Denies having any history suggestive of lactose or gluten intolerance.  Drinks plenty of water.  Previous GI work-up Colonoscopy 11/2014 ([facility])-moderate predominantly sigmoid diverticulosis.  Otherwise normal colonoscopy.  The colon was highly redundant.  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 Otherwise normal colonoscopy. The colon was highly redundant   IR ABLATE LIVER CRYOABLATION  06/06/2019   IR RADIOLOGIST EVAL & MGMT  05/29/2019   KNEE ARTHROSCOPY Right    x2   Low grade squamous intraepithelial dysplasia     TOTAL KNEE ARTHROPLASTY Bilateral 09/30/2015   Procedure: TOTAL KNEE BILATERAL;  Surgeon: Marcene Corning, MD;  Location[facility];  Service: Orthopedics;  Laterality: Bilateral;  Autologous blood donor     Family History  Problem Relation Age of Onset   Breast cancer Mother    Diverticulosis Father    Ulcerative colitis Daughter    Kidney disease Daughter    Colon cancer Neg Hx    Esophageal cancer Neg Hx     Social History   Tobacco Use   Smoking status: Never   Smokeless tobacco: Never  Vaping Use   Vaping Use: Never used  Substance Use Topics   Alcohol use: Yes    Comment: occasionally   Drug use: No    Current Outpatient Medications  Medication Sig Dispense Refill   meloxicam (MOBIC) 15 MG tablet Take 15 mg by mouth as needed.     methocarbamol (ROBAXIN) 500 MG tablet Take 1 tablet (500 mg total) by mouth every 6 (six) hours as needed for muscle spasms. 40 tablet 0   Multiple Vitamin (MULTIVITAMIN WITH MINERALS) TABS tablet  Take 1 tablet by mouth daily.     pregabalin (LYRICA) 75 MG capsule Take 75 mg by mouth at bedtime.     topiramate (TOPAMAX) 50 MG tablet Take 50 mg by mouth 2 (two) times daily.     vitamin B-12 (CYANOCOBALAMIN) 500 MCG tablet Take 500 mcg by mouth daily.     acetaminophen (TYLENOL) 500 MG tablet Take 1,000 mg by mouth every 6 (six) hours as needed (for pain.).     baclofen (LIORESAL) 10 MG tablet Take 10 mg by mouth as needed.     Cholecalciferol (VITAMIN D3) 5000 units TABS Take 5,000 Units by mouth daily.     traMADol (ULTRAM) 50 MG tablet  Take 50-100 mg by mouth 2 (two) times daily as needed for pain. (Patient not taking: Reported on 02/27/2021)  0   Current Facility-Administered Medications  Medication Dose Route Frequency Provider Last Rate Last Admin   0.9 %  sodium chloride infusion  500 mL Intravenous Once Jackquline Denmark, MD        Allergies  Allergen Reactions   No Known Allergies     Review of Systems:  Constitutional: Denies fever, chills, diaphoresis, appetite change and fatigue.  HEENT: Has allergies Respiratory: Denies SOB, DOE, cough, chest tightness,  and wheezing.   Cardiovascular: Denies chest pain, palpitations and leg swelling.  Genitourinary: Denies dysuria, urgency, frequency, hematuria, flank pain and difficulty urinating.  Musculoskeletal: Denies myalgias, has back pain, no joint swelling, arthralgias and gait problem.  Skin: No rash.  Neurological: Denies dizziness, seizures, syncope, weakness, light-headedness, numbness and headaches.  Hematological: Denies adenopathy. Easy bruising, personal or family bleeding history  Psychiatric/Behavioral: Has situational anxiety, no depression     Physical Exam:    BP 139/85    Pulse 72    Temp 97.8 F (36.6 C)    Ht 5\' 5"  (1.651 m)    Wt 170 lb (77.1 kg)    SpO2 100%    BMI 28.29 kg/m  Wt Readings from Last 3 Encounters:  02/27/21 170 lb (77.1 kg)  01/07/21 170 lb 4 oz (77.2 kg)  09/30/15 157 lb 4 oz (71.3 kg)   Constitutional:  Well-developed, in no acute distress. Psychiatric: Normal mood and affect. Behavior is normal. HEENT: Pupils normal.  Conjunctivae are normal. No scleral icterus. Cardiovascular: Normal rate, regular rhythm. No edema Pulmonary/chest: Effort normal and breath sounds normal. No wheezing, rales or rhonchi. Abdominal: Soft, nondistended. Nontender. Bowel sounds active throughout. There are no masses palpable. No hepatomegaly. Rectal: Deferred Neurological: Alert and oriented to person place and time. Skin: Skin is warm and  dry. No rashes noted.       Carmell Austria, MD 02/27/2021, 2:55 PM  Cc: Ronita Hipps, MD

## 2021-02-27 NOTE — Progress Notes (Signed)
Report given to PACU, vss 

## 2021-03-03 ENCOUNTER — Telehealth: Payer: Self-pay | Admitting: *Deleted

## 2021-03-03 NOTE — Telephone Encounter (Signed)
°  Follow up Call-  Call back number 02/27/2021  Post procedure Call Back phone  # [phone]  Permission to leave phone message Yes  Some recent data might be hidden     Patient questions:  Do you have a fever, pain , or abdominal swelling? No. Pain Score  0 *  Have you tolerated food without any problems? Yes.    Have you been able to return to your normal activities? Yes.    Do you have any questions about your discharge instructions: Diet   No. Medications  No. Follow up visit  No.  Do you have questions or concerns about your Care? No.  Actions: * If pain score is 4 or above: No action needed, pain <4.

## 2021-05-22 ENCOUNTER — Other Ambulatory Visit: Payer: Self-pay | Admitting: Orthopaedic Surgery

## 2021-05-22 DIAGNOSIS — G8929 Other chronic pain: Secondary | ICD-10-CM

## 2021-06-01 ENCOUNTER — Ambulatory Visit
Admission: RE | Admit: 2021-06-01 | Discharge: 2021-06-01 | Disposition: A | Discharge status: Home / Self Care | Payer: Managed Care, Other (non HMO) | Source: Ambulatory Visit | Attending: Orthopaedic Surgery | Admitting: Orthopaedic Surgery

## 2021-06-01 ENCOUNTER — Encounter: Payer: Self-pay | Admitting: *Deleted

## 2021-06-01 DIAGNOSIS — G8929 Other chronic pain: Secondary | ICD-10-CM

## 2021-06-01 HISTORY — PX: IR RADIOLOGIST EVAL & MGMT: IMG5224

## 2021-06-01 NOTE — Progress Notes (Signed)
Patient ID: Ashley [name], female   DOB: 11-Jan-1966, 56 y.o.   MRN: [id number] ? ?    ? ? ?Chief Complaint: ?Right knee pain  ? ?Referring Physician(s): ?Dalldorf,Peter ?  ?History of Present Illness: ?Ashley [name] is a 56 y.o. female with no significant past medical history who has a long history of bilateral knee pain ultimately undergoing bilateral total knee replacements in 2017.  While the patient states that her overall knee pain is improved since undergoing the knee replacements, she has experienced persistent bilateral, right greater than left, knee pain which is worse with activity for which she ultimately underwent a technically successful right-sided iovera therapy on 06/06/2019.  Patient is seen in telemedicine consultation today secondary to recurrent though new right knee pain. ? ?Patient states that her right anterior knee and thigh pain resolved completely following the iovera treatment and has not returned. ? ?Unfortunately, patient underwent a RF ablation procedure on her back on March 27 however that evening experienced the acute onset of severe right knee pain which she was told was unrelated to the back procedure.  Patient was subsequently evaluated with knee radiographs demonstrating normal appearance of the prosthesis.  Patient was treated with a cortisone injection however this failed to alleviate her intense knee pain. ? ?As such, patient is interested in undergoing a repeat IOVERA therapy on her right knee. ? ?Patient continues to complain of left-sided knee pain though states this is currently manageable. ? ? ?Past Medical History:  ?Diagnosis Date  ? Anemia   ? Arthritis   ? Diverticulosis   ? Dyspareunia in female   ? History of use of contraceptive intrauterine device (IUD)   ? Spinal headache   ? after c-section  ? Wears glasses   ? ? ?Past Surgical History:  ?Procedure Laterality Date  ? CESAREAN SECTION  1992  ? COLONOSCOPY  12/04/2014  ? Moderate predominantly sigmoid diverticulosis.  Otherwise normal colonoscopy. The colon was highly redundant  ? IR ABLATE LIVER CRYOABLATION  06/06/2019  ? IR RADIOLOGIST EVAL & MGMT  05/29/2019  ? KNEE ARTHROSCOPY Right   ? x2  ? Low grade squamous intraepithelial dysplasia    ? TOTAL KNEE ARTHROPLASTY Bilateral 09/30/2015  ? Procedure: TOTAL KNEE BILATERAL;  Surgeon: Marcene Corning, MD;  Location: [facility] OR;  Service: Orthopedics;  Laterality: Bilateral;  Autologous blood donor   ? ? ?Allergies: ?No known allergies ? ?Medications: ?Prior to Admission medications   ?Medication Sig Start Date End Date Taking? Authorizing Provider  ?acetaminophen (TYLENOL) 500 MG tablet Take 1,000 mg by mouth every 6 (six) hours as needed (for pain.).    [provider]  ?baclofen (LIORESAL) 10 MG tablet Take 10 mg by mouth as needed. 08/20/20   [provider]  ?Cholecalciferol (VITAMIN D3) 5000 units TABS Take 5,000 Units by mouth daily.    [provider]  ?meloxicam (MOBIC) 15 MG tablet Take 15 mg by mouth as needed. 12/25/20   [provider]  ?methocarbamol (ROBAXIN) 500 MG tablet Take 1 tablet (500 mg total) by mouth every 6 (six) hours as needed for muscle spasms. 10/02/15   Elodia Florence, PA-C  ?Multiple Vitamin (MULTIVITAMIN WITH MINERALS) TABS tablet Take 1 tablet by mouth daily.    [provider]  ?pregabalin (LYRICA) 75 MG capsule Take 75 mg by mouth at bedtime. 12/25/20   [provider]  ?topiramate (TOPAMAX) 50 MG tablet Take 50 mg by mouth 2 (two) times daily. 01/04/21   [provider]  ?traMADol (ULTRAM) 50 MG tablet Take 50-100 mg by mouth 2 (two) times daily as needed for pain. ?Patient not taking: Reported on 02/27/2021 06/20/15   [provider]  ?vitamin B-12 (CYANOCOBALAMIN) 500 MCG tablet Take 500 mcg by mouth daily.    [provider]  ?  ? ?Family History  ?Problem Relation Age of Onset  ? Breast cancer Mother   ? Diverticulosis Father   ? Ulcerative colitis Daughter   ? Kidney disease  Daughter   ? Colon cancer Neg Hx   ? Esophageal cancer Neg Hx   ? ? ?Social History  ? ?Socioeconomic History  ? Marital status: Divorced  ?  Spouse name: Not on file  ? Number of children: Not on file  ? Years of education: Not on file  ? Highest education level: Not on file  ?Occupational History  ? Not on file  ?Tobacco Use  ? Smoking status: Never  ? Smokeless tobacco: Never  ?Vaping Use  ? Vaping Use: Never used  ?Substance and Sexual Activity  ? Alcohol use: Yes  ?  Comment: occasionally  ? Drug use: No  ? Sexual activity: Not on file  ?Other Topics Concern  ? Not on file  ?Social History Narrative  ? Not on file  ? ?Social Determinants of Health  ? ?Financial Resource Strain: Not on file  ?Food Insecurity: Not on file  ?Transportation Needs: Not on file  ?Physical Activity: Not on file  ?Stress: Not on file  ?Social Connections: Not on file  ? ? ?ECOG Status: ?2 - Symptomatic, <50% confined to bed ? ?Review of Systems ? ?Review of Systems: A 12 point ROS discussed and pertinent positives are indicated in the HPI above.  All other systems are negative. ? ?Physical Exam ?No direct physical exam was performed (except for noted visual exam findings with Video Visits).  ? ?Vital Signs: ?There were no vitals taken for this visit. ? ?Imaging: ? ?Ultrasound-guided right knee IOVERA therapy-06/06/2019 ? ?Labs: ? ?CBC: ?No results for input(s): WBC, HGB, HCT, PLT in the last 8760 hours. ? ?COAGS: ?No results for input(s): INR, APTT in the last 8760 hours. ? ?BMP: ?No results for input(s): NA, K, CL, CO2, GLUCOSE, BUN, CALCIUM, CREATININE, GFRNONAA, GFRAA in the last 8760 hours. ? ?Invalid input(s): CMP ? ?LIVER FUNCTION TESTS: ?No results for input(s): BILITOT, AST, ALT, ALKPHOS, PROT, ALBUMIN in the last 8760 hours. ? ?TUMOR MARKERS: ?No results for input(s): AFPTM, CEA, CA199, CHROMGRNA in the last 8760 hours. ? ?Assessment and Plan: ? ?Ashley [name] is a 56 y.o. female with no significant past medical history who has  a long history of bilateral knee pain ultimately undergoing bilateral total knee replacements in 2017.  While the patient states that her overall knee pain is improved since undergoing the knee replacements, she has experienced persistent bilateral, right greater than left, knee pain which is worse with activity for which she ultimately underwent a technically successful right-sided iovera therapy on 06/06/2019.  Patient is seen in telemedicine consultation today secondary to recurrent though new right knee pain. ? ?Patient states that her right anterior knee and thigh pain resolved completely following the IOVERA treatment and has not returned. ? ?Unfortunately, patient underwent a RF ablation procedure on her back on March 27 however that evening experienced the acute onset of severe right knee pain which she was told was unrelated to the back procedure.  Patient was subsequently evaluated with knee radiographs demonstrating normal appearance  of the prosthesis.  Patient was treated with a cortisone injection however this failed to alleviate her intense knee pain. ? ?As such, patient is interested in undergoing a repeat IOVERA therapy on her right knee. ? ?Patient has undergone the procedure previously however as it has been 2 years I once again explained that the IOVERA treatment is a modality that utilizes cyroneurolysis technology to temporarily alter the pain recepting nerves supplying the anterior aspect of the knee, in hopes of achieving a clinically significant reduction in knee pain.  ?I explained that if successful, the patient will experience a rapid pain reduction which can last for approximately 3 months.  I explained that while their knee pain may be reduced, the structural damage of the knee remains, and thus, this is not a curative technology and their knee pain will return.   ?  ?Risks associated with the procedure include bleeding/bruising, infection and post procedural paresthesias/weakness. ?   ?The procedure is performed as an outpatient basis at [facility].  The procedure is performed soley with local anesthesia, and therefore the patient does need to be NPO, there is no postproce

## 2021-06-03 ENCOUNTER — Other Ambulatory Visit (HOSPITAL_COMMUNITY): Payer: Self-pay | Admitting: Interventional Radiology

## 2021-06-03 DIAGNOSIS — M25561 Pain in right knee: Secondary | ICD-10-CM

## 2021-07-13 IMAGING — US IR US GUIDED PERIPHERAL NERVE CRYOABLATION KNEE RIGHT
1 series · 12 of 16 positions shown · non-contrast
Comparison: None

INDICATION: Right sided knee pain. Please refer to formal consultation in the
[facility] EMR for additional details.
TECHNIQUE: Informed written consent was obtained from the patient after a
thorough discussion of the procedural risks, benefits and
alternatives. All questions were addressed. A timeout was performed
prior to the initiation of the procedure.

[Series 1: (id) · 16 acquisitions, 12 frames shown]
[im 1/16]
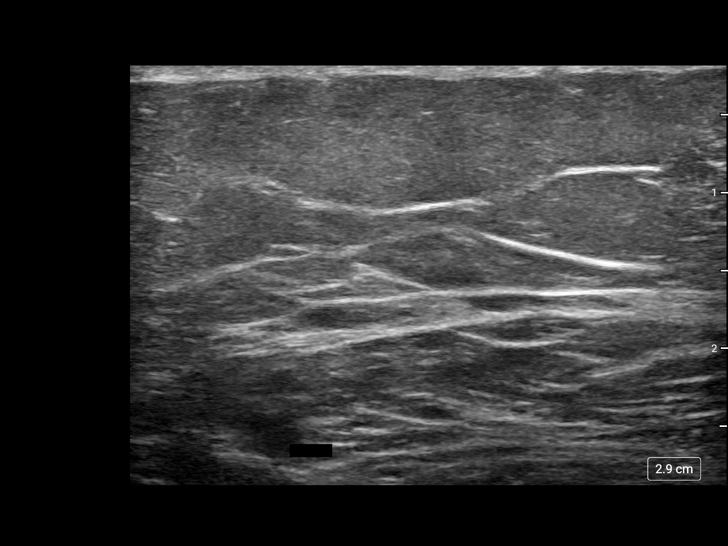
[im 3/16]
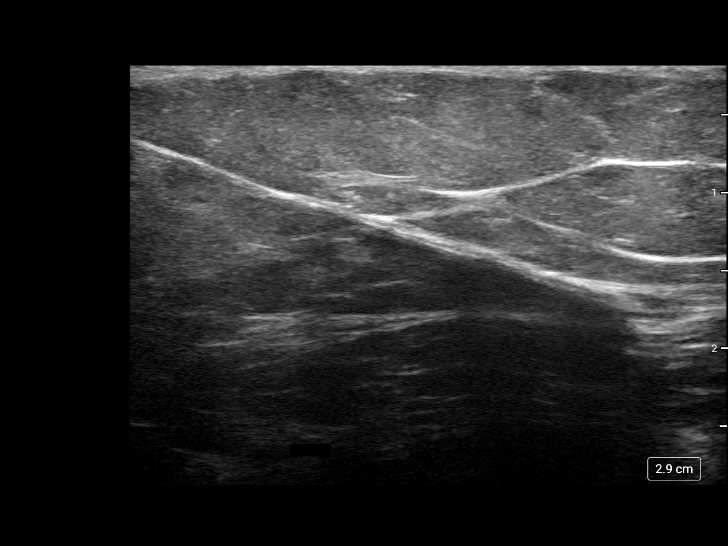
[im 4/16]
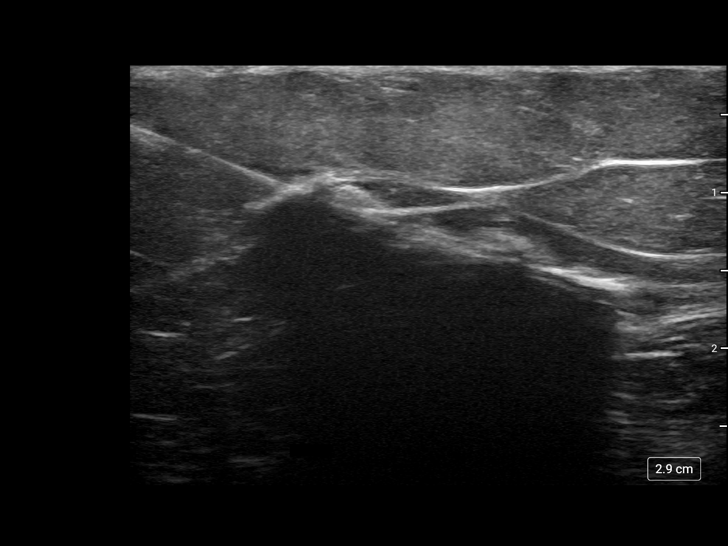
[im 5/16]
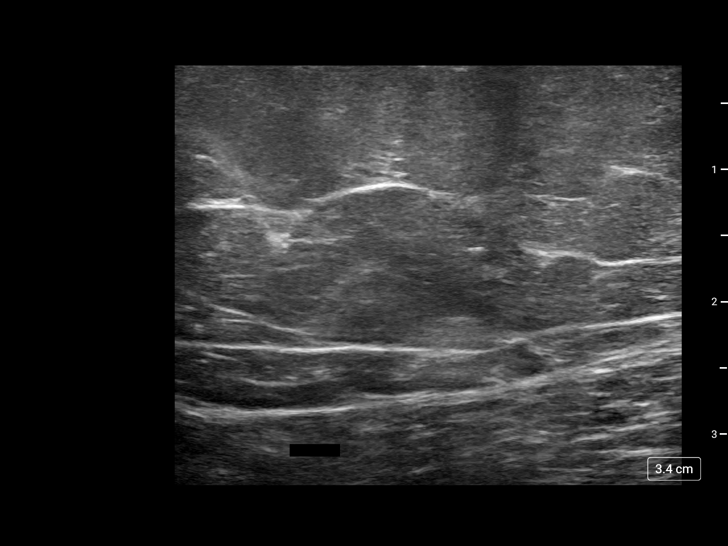
[im 7/16]
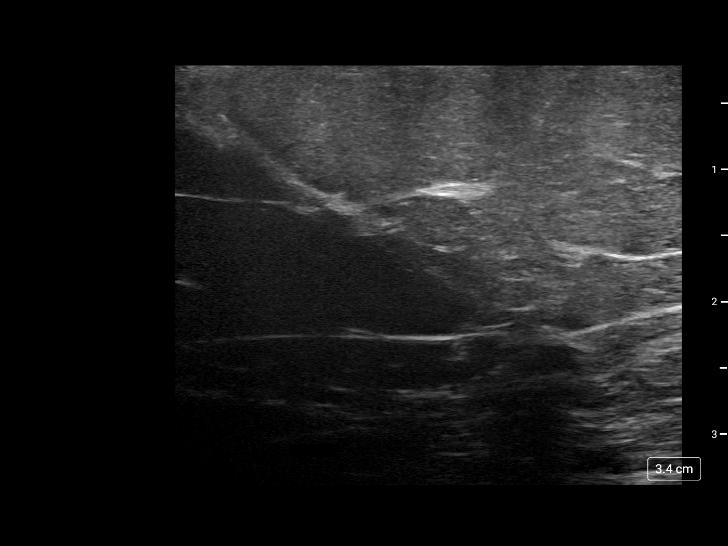
[im 8/16]
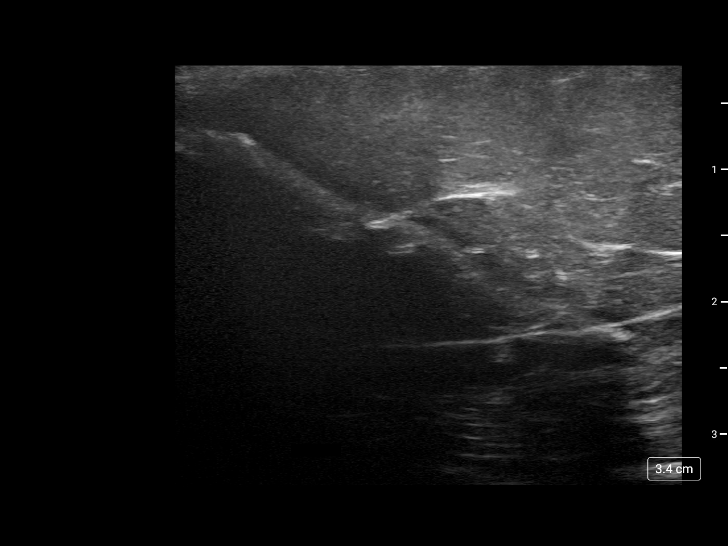
[im 9/16]
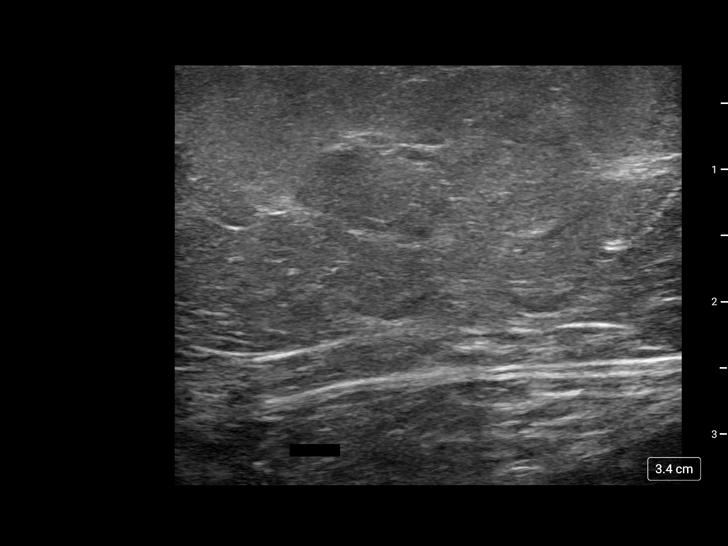
[im 11/16]
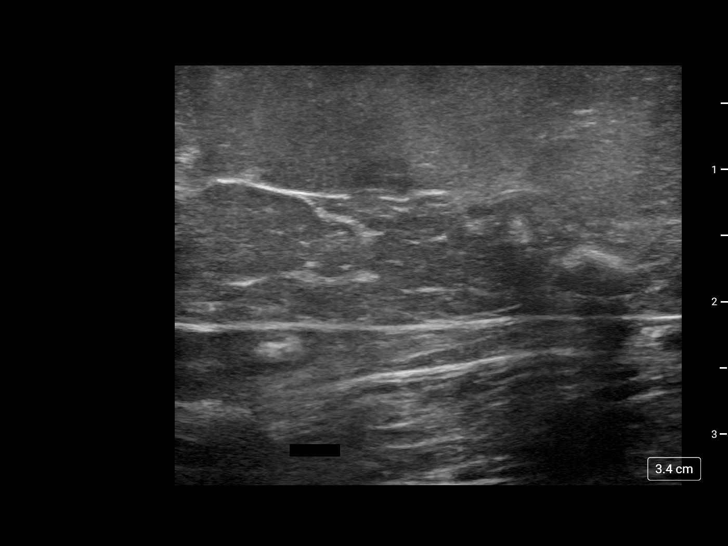
[im 12/16]
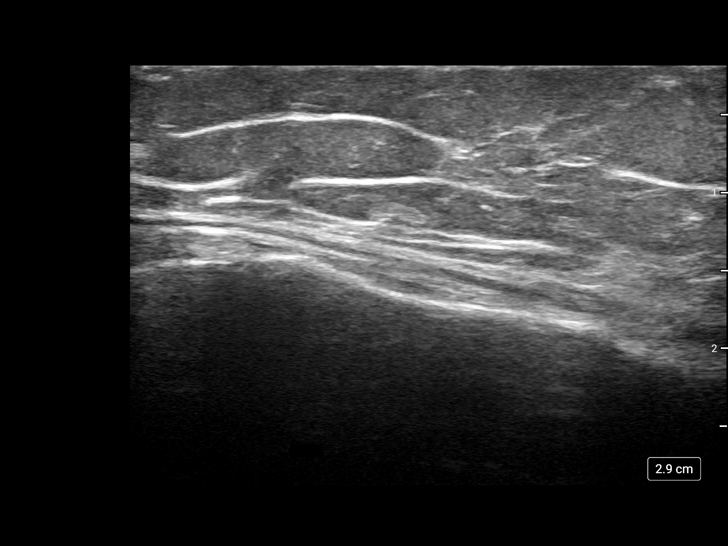
[im 13/16]
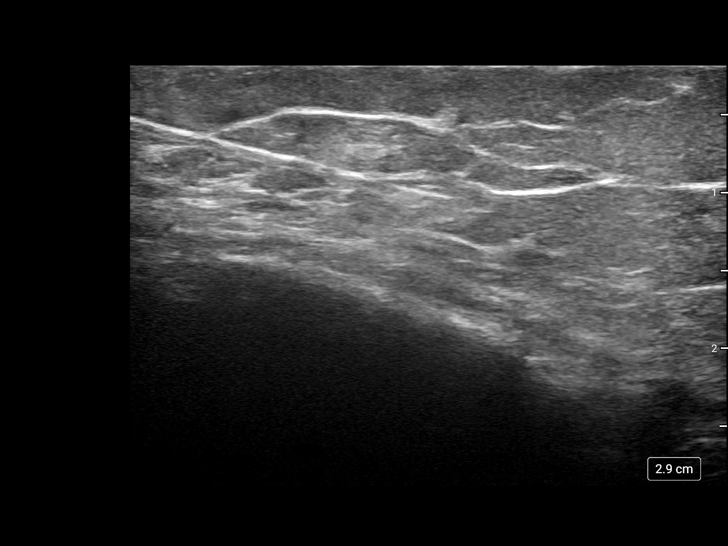
[im 15/16]
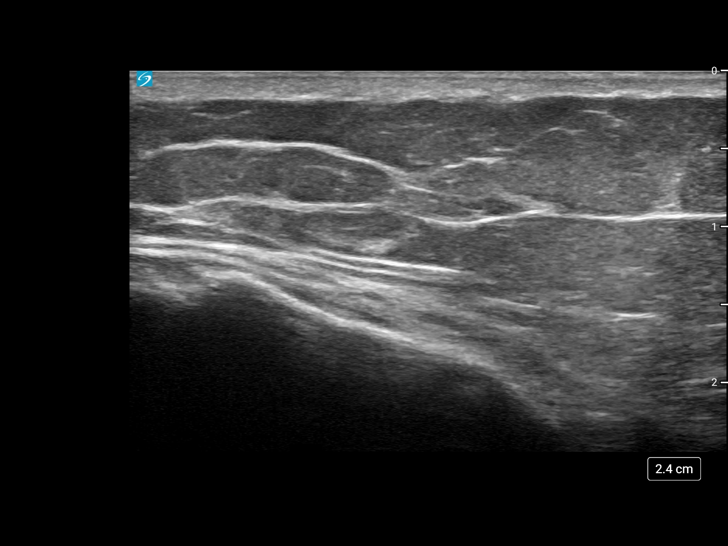
[im 16/16]
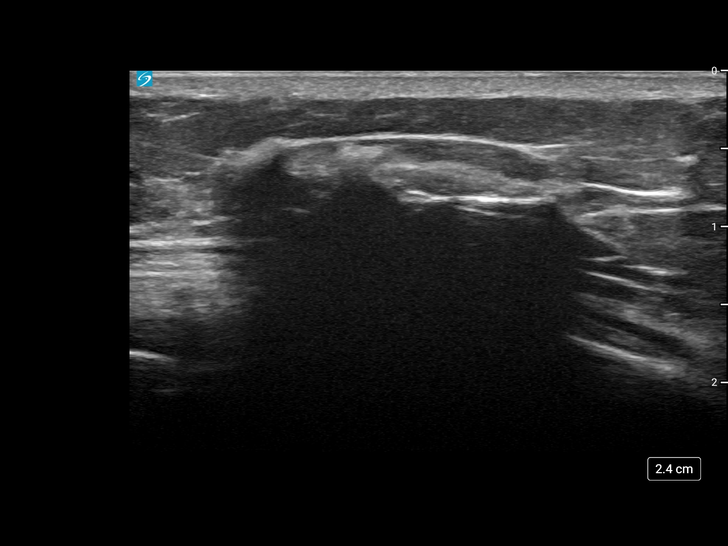

[12 of 16 positions shown; findings below may reference images not displayed]

EXAM:
1. ULTRASOUND-GUIDED CRYONEUROLYSIS (IOVERA) OF THE INTERMEDIATE
DIVISION OF THE ANTERIOR FEMORAL CUTANEOUS NERVE (IAFCN)
2. ULTRASOUND-GUIDED CRYONEUROLYSIS (IOVERA) OF THE MEDIAL DIVISION
OF THE ANTERIOR FEMORAL CUTANEOUS NERVE (MAFCN)
3. ULTRASOUND-GUIDED CRYONEUROLYSIS (IOVERA) OF THE INFRAPATELLAR
BRANCH OF THE SAPHENOUS NERVE (ISN)
MEDICATIONS:
None

ANESTHESIA/SEDATION:
None

FLUOROSCOPY TIME:  None

COMPLICATIONS:
None immediate.
The patient was positioned supine and sonographic evaluation of the
affected thigh and knee was performed by the dictating
interventional radiologist. Selected nerves were marked with a skin
pen and the procedure was planned.

Attention was first paid towards cryoablation of the intermediate
division of the anterior femoral cutaneous nerve (IAFCN).

The skin overlying the selected dermal access site was cleaned with
Chlorhexidine. Local anesthesia was provided with a combination of
topical anesthetic spray followed by subcutaneous administration 1%
lidocaine with epinephrine.

Next, under direct ultrasound guidance, the 20 gauge Smart Tip 190
IOVERA cryoneurolysis probe was advanced with tip ultimately
positioned immediately adjacent to the targeted nerve. Multiple
ultrasound images were saved for procedural documentation purposes.

The cryoneurolysis was then performed for 1 minute, 45 seconds for a
total of 1 cycle(s) with intermittent sonographer evaluation.

The identical procedure was repeated targeting the medial division
of the anterior femoral cutaneous nerve (MAFCN) and the
infrapatellar branch of the saphenous nerve (ISN). The
cryoneurolysis was performed for each of the above nerve for 1
minute, 45 seconds for a total of 2 cycles each, all with
intermittent sonographer evaluation. Multiple ultrasound images were
saved for procedural documentation purposes.

Following completion of the procedure, sensation about the anterior
aspect of the knee was assessed and a postprocedural pain scale was
obtained.

Dressings were applied. The patient tolerated the procedure well
without immediate postprocedural complication.
FINDINGS: Sonographic guidance confirms appropriate positioning of the Smart
Tip 190 IOVERA cryoneurolysis probe immediately adjacent to the
targeted nerves with reduction in patient's postprocedural pain
scale as follows:

Pre procedural ARPIT score: 35/96

Pre procedural knee pain:

At rest: - [DATE]

With activity: 3-5/10

Post procedural knee pain:

At rest: - 0/10

With activity: 0/10
IMPRESSION: Technically successful cryoneurolysis (IOVERA) of the IAFCN, MAFCN,
and the ISN for right sided knee pain with clinically significant
reduction in postprocedural pain scale.

## 2021-07-20 ENCOUNTER — Other Ambulatory Visit: Payer: Self-pay | Admitting: Student

## 2021-07-22 ENCOUNTER — Other Ambulatory Visit (HOSPITAL_COMMUNITY): Payer: Self-pay | Admitting: Interventional Radiology

## 2021-07-22 ENCOUNTER — Ambulatory Visit (HOSPITAL_COMMUNITY)
Admission: RE | Admit: 2021-07-22 | Discharge: 2021-07-22 | Disposition: A | Discharge status: Home / Self Care | Payer: Managed Care, Other (non HMO) | Source: Ambulatory Visit | Attending: Interventional Radiology | Admitting: Interventional Radiology

## 2021-07-22 DIAGNOSIS — M25561 Pain in right knee: Secondary | ICD-10-CM | POA: Insufficient documentation

## 2021-07-22 HISTORY — PX: IR ABLATE LIVER CRYOABLATION: IMG5524

## 2021-07-22 MED ORDER — LIDOCAINE-EPINEPHRINE 1 %-1:100000 IJ SOLN
INTRAMUSCULAR | Status: DC | PRN
  Administered 2021-07-22: 10 mL

## 2021-07-22 MED ORDER — LIDOCAINE HCL 1 % IJ SOLN
INTRAMUSCULAR | Status: AC
  Filled 2021-07-22: qty 20

## 2021-07-22 MED ORDER — LIDOCAINE-EPINEPHRINE 1 %-1:100000 IJ SOLN
INTRAMUSCULAR | Status: AC
  Filled 2021-07-22: qty 1

## 2021-07-22 NOTE — Procedures (Signed)
Pre procedural Dx: Right sided knee pain Post procedural Dx: Same  Technically successful cryoneurolysis (IOVERA) of the IAFCN, MAFCN, and the ISN for right sided knee pain with clinically significant reduction in postprocedural pain scale.  EBL: None Complications: None immediate  Pre procedural WOMAC score: 59/96  Pre procedural knee pain: At rest: 0/10 With activity: 4-5/10  Post procedural knee pain: At rest: 0/10 With activity: 0/10  Katherina Right, MD Pager #: [phone]

## 2021-08-04 ENCOUNTER — Other Ambulatory Visit: Payer: Self-pay | Admitting: Interventional Radiology

## 2021-08-04 DIAGNOSIS — G8929 Other chronic pain: Secondary | ICD-10-CM

## 2021-08-13 ENCOUNTER — Other Ambulatory Visit: Payer: Self-pay | Admitting: Orthopedic Surgery

## 2021-08-13 ENCOUNTER — Other Ambulatory Visit (HOSPITAL_COMMUNITY): Payer: Self-pay | Admitting: Orthopedic Surgery

## 2021-08-13 DIAGNOSIS — Z96651 Presence of right artificial knee joint: Secondary | ICD-10-CM

## 2021-08-19 ENCOUNTER — Other Ambulatory Visit (HOSPITAL_COMMUNITY): Payer: Self-pay | Admitting: Interventional Radiology

## 2021-08-19 DIAGNOSIS — M25561 Pain in right knee: Secondary | ICD-10-CM

## 2021-08-19 NOTE — Addendum Note (Signed)
Encounter addended by: Arby Barrette on: 08/19/2021 4:05 PM  Actions taken: Imaging Exam ended

## 2021-08-19 NOTE — Addendum Note (Signed)
Encounter addended by: Arby Barrette on: 08/19/2021 4:12 PM  Actions taken: Imaging Exam ended

## 2021-09-04 ENCOUNTER — Encounter (HOSPITAL_COMMUNITY)
Admission: RE | Admit: 2021-09-04 | Discharge: 2021-09-04 | Disposition: A | Discharge status: Home / Self Care | Payer: Managed Care, Other (non HMO) | Source: Ambulatory Visit | Attending: Orthopedic Surgery | Admitting: Orthopedic Surgery

## 2021-09-04 DIAGNOSIS — Z96651 Presence of right artificial knee joint: Secondary | ICD-10-CM | POA: Diagnosis present

## 2021-09-04 MED ORDER — TECHNETIUM TC 99M MEDRONATE IV KIT
20.0000 | PACK | Freq: Once | INTRAVENOUS | Status: AC | PRN
  Administered 2021-09-04: 20 via INTRAVENOUS

## 2022-03-02 NOTE — H&P (Signed)
TOTAL KNEE ADMISSION H&P  Patient is being admitted for revision right total knee arthroplasty.  Subjective:  Chief Complaint: Right knee pain.  HPI: Ashley [name], 57 y.o. female has a history of pain and functional disability in the right knee due to  loose total knee arthroplasty  and has failed non-surgical conservative treatments for greater than 12 weeks to include flexibility and strengthening excercises and activity modification. Onset of symptoms was gradual, starting 3 years ago with gradually worsening course since that time. Patient currently rates pain in the right knee at 6 out of 10 with activity. Patient has worsening of pain with activity and weight bearing and pain that interferes with activities of daily living. Patient has evidence of  prosthetic loosening on NM bone scan . There is no active infection.  Patient Active Problem List   Diagnosis Date Noted   Bilateral primary osteoarthritis of knee 09/30/2015    Past Medical History:  Diagnosis Date   Anemia    Arthritis    Diverticulosis    Dyspareunia in female    History of use of contraceptive intrauterine device (IUD)    Spinal headache    after c-section   Wears glasses     Past Surgical History:  Procedure Laterality Date   CESAREAN SECTION  1992   COLONOSCOPY  12/04/2014   Moderate predominantly sigmoid diverticulosis. Otherwise normal colonoscopy. The colon was highly redundant   IR ABLATE LIVER CRYOABLATION  06/06/2019   IR ABLATE LIVER CRYOABLATION  07/22/2021   IR ABLATE LIVER CRYOABLATION  07/22/2021   IR ABLATE LIVER CRYOABLATION  07/22/2021   IR ABLATE LIVER CRYOABLATION  07/22/2021   IR RADIOLOGIST EVAL & MGMT  05/29/2019   IR RADIOLOGIST EVAL & MGMT  06/01/2021   KNEE ARTHROSCOPY Right    x2   Low grade squamous intraepithelial dysplasia     TOTAL KNEE ARTHROPLASTY Bilateral 09/30/2015   Procedure: TOTAL KNEE BILATERAL;  Surgeon: Melrose Nakayama, MD;  Location: [facility];  Service: Orthopedics;   Laterality: Bilateral;  Autologous blood donor     Prior to Admission medications   Medication Sig Start Date End Date Taking? Authorizing Provider  acetaminophen (TYLENOL) 500 MG tablet Take 1,000 mg by mouth every 6 (six) hours as needed (for pain.).    [provider]  baclofen (LIORESAL) 10 MG tablet Take 10 mg by mouth as needed. 08/20/20   [provider]  Cholecalciferol (VITAMIN D3) 5000 units TABS Take 5,000 Units by mouth daily.    [provider]  meloxicam (MOBIC) 15 MG tablet Take 15 mg by mouth as needed. 12/25/20   [provider]  methocarbamol (ROBAXIN) 500 MG tablet Take 1 tablet (500 mg total) by mouth every 6 (six) hours as needed for muscle spasms. 10/02/15   Loni Dolly, PA-C  Multiple Vitamin (MULTIVITAMIN WITH MINERALS) TABS tablet Take 1 tablet by mouth daily.    [provider]  pregabalin (LYRICA) 75 MG capsule Take 75 mg by mouth at bedtime. 12/25/20   [provider]  topiramate (TOPAMAX) 50 MG tablet Take 50 mg by mouth 2 (two) times daily. 01/04/21   [provider]  traMADol (ULTRAM) 50 MG tablet Take 50-100 mg by mouth 2 (two) times daily as needed for pain. Patient not taking: Reported on 02/27/2021 06/20/15   [provider]  vitamin B-12 (CYANOCOBALAMIN) 500 MCG tablet Take 500 mcg by mouth daily.    [provider]    Allergies  Allergen Reactions  No Known Allergies     Social History   Socioeconomic History   Marital status: Divorced    Spouse name: Not on file   Number of children: Not on file   Years of education: Not on file   Highest education level: Not on file  Occupational History   Not on file  Tobacco Use   Smoking status: Never   Smokeless tobacco: Never  Vaping Use   Vaping Use: Never used  Substance and Sexual Activity   Alcohol use: Yes    Comment: occasionally   Drug use: No   Sexual activity: Not on file  Other Topics Concern   Not on file   Social History Narrative   Not on file   Social Determinants of Health   Financial Resource Strain: Not on file  Food Insecurity: Not on file  Transportation Needs: Not on file  Physical Activity: Not on file  Stress: Not on file  Social Connections: Not on file  Intimate Partner Violence: Not on file    Tobacco Use: Low Risk  (08/31/2021)   Patient History    Smoking Tobacco Use: Never    Smokeless Tobacco Use: Never    Passive Exposure: Not on file   Social History   Substance and Sexual Activity  Alcohol Use Yes   Comment: occasionally    Family History  Problem Relation Age of Onset   Breast cancer Mother    Diverticulosis Father    Ulcerative colitis Daughter    Kidney disease Daughter    Colon cancer Neg Hx    Esophageal cancer Neg Hx     ROS  Objective:  Physical Exam: The patient is a pleasant, well-developed female alert and oriented in no apparent distress.    Right Knee Exam:  No effusion present. No swelling present.  The range of motion is: 0 to 95 degrees with a firm endpoint at 95 degrees.  No crepitus on range of motion of the knee.  No specific medial joint line tenderness.  Slight lateral joint line tenderness.  Fair amount of varus/valgus laxity and AP laxity.    The patient's sensation and motor function are intact in their lower extremities. Their distal pulses are 2+. The bilateral calves are soft and non-tender.  RESULTS   Bone scan of the right knee demonstrates increased uptake in the lateral femoral condyle and lateral tibial plateau right around the prosthesis.  Assessment/Plan:  End stage arthritis, right knee   The patient history, physical examination, clinical judgment of the provider and imaging studies are consistent with end stage degenerative joint disease of the right knee and total knee arthroplasty is deemed medically necessary. The treatment options including medical management, injection therapy arthroscopy and  arthroplasty were discussed at length. The risks and benefits of total knee arthroplasty were presented and reviewed. The risks due to aseptic loosening, infection, stiffness, patella tracking problems, thromboembolic complications and other imponderables were discussed. The patient acknowledged the explanation, agreed to proceed with the plan and consent was signed. Patient is being admitted for inpatient treatment for surgery, pain control, PT, OT, prophylactic antibiotics, VTE prophylaxis, progressive ambulation and ADLs and discharge planning. The patient is planning to be discharged  home with OPPT .  Anticipated LOS equal to or greater than 2 midnights due to - Age 84 and older with one or more of the following:  - Obesity  - Expected need for hospital services (PT, OT, Nursing) required for safe  discharge  - Anticipated  need for postoperative skilled nursing care or inpatient rehab  - Active co-morbidities: None OR   - Unanticipated findings during/Post Surgery: Slow post-op progression: GI, pain control, mobility  - Patient is a high risk of re-admission due to: None  Therapy Plans: [place] Disposition: Home with daughter Planned DVT Prophylaxis: Aspirin 338m DME Needed: None PCP: JSerita Grammes MD (clearance received) TXA: IV Allergies: NKDA Anesthesia Concerns: None BMI: 26.4 Last HgbA1c: Not diabetic Pharmacy: [facility] [place]  Other:   - Patient was instructed on what medications to stop prior to surgery. - Follow-up visit in 2 weeks with Dr. AWynelle Link- Begin physical therapy following surgery - Pre-operative lab work as pre-surgical testing - Prescriptions will be provided in hospital at time of discharge  SShearon Balo PA-C Orthopedic Surgery [facility] [place]

## 2022-03-10 NOTE — Progress Notes (Signed)
Patient states she take Topamax for appetite suppression   COVID Vaccine Completed: yes   Date of COVID positive in last 90 days: no  PCP - Serita Grammes, MD Cardiologist - n/a  Medical/cardiac clearance by Serita Grammes 12/24/21  Chest x-ray - n/a EKG -  03/11/22 Epic/chart Stress Test - n/a ECHO - n/a Cardiac Cath - n/a Pacemaker/ICD device last checked: n/a Spinal Cord Stimulator: n/a  Bowel Prep - no  Sleep Study - n/a CPAP -   Fasting Blood Sugar - n/a Checks Blood Sugar _____ times a day  Last dose of GLP1 agonist-  N/A GLP1 instructions:  N/A   Last dose of SGLT-2 inhibitors-  N/A SGLT-2 instructions: N/A   Blood Thinner Instructions: n/a Aspirin Instructions: Last Dose:  Activity level: Can go up a flight of stairs and perform activities of daily living without stopping and without symptoms of chest pain or shortness of breath. Difficulty with stairs due to knee  Anesthesia review:   Patient denies shortness of breath, fever, cough and chest pain at PAT appointment  Patient verbalized understanding of instructions that were given to them at the PAT appointment. Patient was also instructed that they will need to review over the PAT instructions again at home before surgery.

## 2022-03-10 NOTE — Patient Instructions (Signed)
SURGICAL WAITING ROOM VISITATION  Patients having surgery or a procedure may have no more than 2 support people in the waiting area - these visitors may rotate.    Children under the age of 82 must have an adult with them who is not the patient.  Due to an increase in RSV and influenza rates and associated hospitalizations, children ages 46 and under may not visit patients in [place].  If the patient needs to stay at the hospital during part of their recovery, the visitor guidelines for inpatient rooms apply. Pre-op nurse will coordinate an appropriate time for 1 support person to accompany patient in pre-op.  This support person may not rotate.    Please refer to the [facility] website for the visitor guidelines for Inpatients (after your surgery is over and you are in a regular room).    Your procedure is scheduled on: 03/24/22   Report to [facility] Main Entrance    Report to admitting at 6:00 AM   Call this number if you have problems the morning of surgery [phone]   Do not eat food :After Midnight.   After Midnight you may have the following liquids until 5:30 AM DAY OF SURGERY  Water Non-Citrus Juices (without pulp, NO RED-Apple, White grape, White cranberry) Black Coffee (NO MILK/CREAM OR CREAMERS, sugar ok)  Clear Tea (NO MILK/CREAM OR CREAMERS, sugar ok) regular and decaf                             Plain Jell-O (NO RED)                                           Fruit ices (not with fruit pulp, NO RED)                                     Popsicles (NO RED)                                                               Sports drinks like Gatorade (NO RED)               The day of surgery:  Drink ONE (1) Pre-Surgery Clear Ensure at 5:30 AM the morning of surgery. Drink in one sitting. Do not sip.  This drink was given to you during your hospital  pre-op appointment visit. Nothing else to drink after completing the  Pre-Surgery Clear Ensure.           If you have questions, please contact your surgeon's office.   FOLLOW BOWEL PREP AND ANY ADDITIONAL PRE OP INSTRUCTIONS YOU RECEIVED FROM YOUR SURGEON'S OFFICE!!!     Oral Hygiene is also important to reduce your risk of infection.                                    Remember - BRUSH YOUR TEETH THE MORNING OF SURGERY WITH YOUR REGULAR TOOTHPASTE  DENTURES WILL BE REMOVED  PRIOR TO SURGERY PLEASE DO NOT APPLY "Poly grip" OR ADHESIVES!!!   Take these medicines the morning of surgery with A SIP OF WATER: Tylenol, Rosuvastatin, Topiramate, Tramadol                              You may not have any metal on your body including hair pins, jewelry, and body piercing             Do not wear make-up, lotions, powders, perfumes, or deodorant  Do not wear nail polish including gel and S&S, artificial/acrylic nails, or any other type of covering on natural nails including finger and toenails. If you have artificial nails, gel coating, etc. that needs to be removed by a nail salon please have this removed prior to surgery or surgery may need to be canceled/ delayed if the surgeon/ anesthesia feels like they are unable to be safely monitored.   Do not shave  48 hours prior to surgery.    Do not bring valuables to the hospital. Ashley [name].   Contacts, glasses, dentures or bridgework may not be worn into surgery.   Bring small overnight bag day of surgery.   DO NOT [place]. PHARMACY WILL DISPENSE MEDICATIONS LISTED ON YOUR MEDICATION LIST TO YOU DURING YOUR ADMISSION [place]!   Special Instructions: Bring a copy of your healthcare power of attorney and living will documents the day of surgery if you haven't scanned them before.              Please read over the following fact sheets you were given: IF [place] [name]   If you received a  COVID test during your pre-op visit  it is requested that you wear a mask when out in public, stay away from anyone that may not be feeling well and notify your surgeon if you develop symptoms. If you test positive for Covid or have been in contact with anyone that has tested positive in the last 10 days please notify you surgeon.    [place] - Preparing for Surgery Before surgery, you can play an important role.  Because skin is not sterile, your skin needs to be as free of germs as possible.  You can reduce the number of germs on your skin by washing with CHG (chlorahexidine gluconate) soap before surgery.  CHG is an antiseptic cleaner which kills germs and bonds with the skin to continue killing germs even after washing. Please DO NOT use if you have an allergy to CHG or antibacterial soaps.  If your skin becomes reddened/irritated stop using the CHG and inform your nurse when you arrive at Short Stay. Do not shave (including legs and underarms) for at least 48 hours prior to the first CHG shower.  You may shave your face/neck.  Please follow these instructions carefully:  1.  Shower with CHG Soap the night before surgery and the  morning of surgery.  2.  If you choose to wash your hair, wash your hair first as usual with your normal  shampoo.  3.  After you shampoo, rinse your hair and body thoroughly to remove the shampoo.  4.  Use CHG as you would any other liquid soap.  You can apply chg directly to the skin and wash.  Gently with a scrungie or clean washcloth.  5.  Apply the CHG Soap to your body ONLY FROM THE NECK DOWN.   Do   not use on face/ open                           Wound or open sores. Avoid contact with eyes, ears mouth and   genitals (private parts).                       Wash face,  Genitals (private parts) with your normal soap.             6.  Wash thoroughly, paying special attention to the area where your    surgery  will be performed.  7.   Thoroughly rinse your body with warm water from the neck down.  8.  DO NOT shower/wash with your normal soap after using and rinsing off the CHG Soap.                9.  Pat yourself dry with a clean towel.            10.  Wear clean pajamas.            11.  Place clean sheets on your bed the night of your first shower and do not  sleep with pets. Day of Surgery : Do not apply any lotions/deodorants the morning of surgery.  Please wear clean clothes to the hospital/surgery center.  FAILURE TO FOLLOW THESE INSTRUCTIONS MAY RESULT IN THE CANCELLATION OF YOUR SURGERY  PATIENT SIGNATURE_________________________________  NURSE SIGNATURE__________________________________  ________________________________________________________________________  Adam Phenix  An incentive spirometer is a tool that can help keep your lungs clear and active. This tool measures how well you are filling your lungs with each breath. Taking long deep breaths may help reverse or decrease the chance of developing breathing (pulmonary) problems (especially infection) following: A long period of time when you are unable to move or be active. BEFORE THE PROCEDURE  If the spirometer includes an indicator to show your best effort, your nurse or respiratory therapist will set it to a desired goal. If possible, sit up straight or lean slightly forward. Try not to slouch. Hold the incentive spirometer in an upright position. INSTRUCTIONS FOR USE  Sit on the edge of your bed if possible, or sit up as far as you can in bed or on a chair. Hold the incentive spirometer in an upright position. Breathe out normally. Place the mouthpiece in your mouth and seal your lips tightly around it. Breathe in slowly and as deeply as possible, raising the piston or the ball toward the top of the column. Hold your breath for 3-5 seconds or for as long as possible. Allow the piston or ball to fall to the bottom of the column. Remove the  mouthpiece from your mouth and breathe out normally. Rest for a few seconds and repeat Steps 1 through 7 at least 10 times every 1-2 hours when you are awake. Take your time and take a few normal breaths between deep breaths. The spirometer may include an indicator to show your best effort. Use the indicator as a goal to work toward during each repetition. After each set of 10 deep breaths, practice coughing to be sure your  lungs are clear. If you have an incision (the cut made at the time of surgery), support your incision when coughing by placing a pillow or rolled up towels firmly against it. Once you are able to get out of bed, walk around indoors and cough well. You may stop using the incentive spirometer when instructed by your caregiver.  RISKS AND COMPLICATIONS Take your time so you do not get dizzy or light-headed. If you are in pain, you may need to take or ask for pain medication before doing incentive spirometry. It is harder to take a deep breath if you are having pain. AFTER USE Rest and breathe slowly and easily. It can be helpful to keep track of a log of your progress. Your caregiver can provide you with a simple table to help with this. If you are using the spirometer at home, follow these instructions: [place] IF:  You are having difficultly using the spirometer. You have trouble using the spirometer as often as instructed. Your pain medication is not giving enough relief while using the spirometer. You develop fever of 100.5 F (38.1 C) or higher. SEEK IMMEDIATE MEDICAL CARE IF:  You cough up bloody sputum that had not been present before. You develop fever of 102 F (38.9 C) or greater. You develop worsening pain at or near the incision site. MAKE SURE YOU:  Understand these instructions. Will watch your condition. Will get help right away if you are not doing well or get worse. Document Released: 05/17/2006 Document Revised: 03/29/2011 Document Reviewed:  07/18/2006 Exit[employer] Patient Information 2014 [employer], [place].   ________________________________________________________________________ WHAT IS A BLOOD TRANSFUSION? Blood Transfusion Information  A transfusion is the replacement of blood or some of its parts. Blood is made up of multiple cells which provide different functions. Red blood cells carry oxygen and are used for blood loss replacement. White blood cells fight against infection. Platelets control bleeding. Plasma helps clot blood. Other blood products are available for specialized needs, such as hemophilia or other clotting disorders. BEFORE THE TRANSFUSION  Who gives blood for transfusions?  Healthy volunteers who are fully evaluated to make sure their blood is safe. This is blood bank blood. Transfusion therapy is the safest it has ever been in the practice of medicine. Before blood is taken from a donor, a complete history is taken to make sure that person has no history of diseases nor engages in risky social behavior (examples are intravenous drug use or sexual activity with multiple partners). The donor's travel history is screened to minimize risk of transmitting infections, such as malaria. The donated blood is tested for signs of infectious diseases, such as HIV and hepatitis. The blood is then tested to be sure it is compatible with you in order to minimize the chance of a transfusion reaction. If you or a relative donates blood, this is often done in anticipation of surgery and is not appropriate for emergency situations. It takes many days to process the donated blood. RISKS AND COMPLICATIONS Although transfusion therapy is very safe and saves many lives, the main dangers of transfusion include:  Getting an infectious disease. Developing a transfusion reaction. This is an allergic reaction to something in the blood you were given. Every precaution is taken to prevent this. The decision to have a blood transfusion has been  considered carefully by your caregiver before blood is given. Blood is not given unless the benefits outweigh the risks. AFTER THE TRANSFUSION Right after receiving a blood transfusion, you  will usually feel much better and more energetic. This is especially true if your red blood cells have gotten low (anemic). The transfusion raises the level of the red blood cells which carry oxygen, and this usually causes an energy increase. The nurse administering the transfusion will monitor you carefully for complications. HOME CARE INSTRUCTIONS  No special instructions are needed after a transfusion. You may find your energy is better. Speak with your caregiver about any limitations on activity for underlying diseases you may have. SEEK MEDICAL CARE IF:  Your condition is not improving after your transfusion. You develop redness or irritation at the intravenous (IV) site. SEEK IMMEDIATE MEDICAL CARE IF:  Any of the following symptoms occur over the next 12 hours: Shaking chills. You have a temperature by mouth above 102 F (38.9 C), not controlled by medicine. Chest, back, or muscle pain. People around you feel you are not acting correctly or are confused. Shortness of breath or difficulty breathing. Dizziness and fainting. You get a rash or develop hives. You have a decrease in urine output. Your urine turns a dark color or changes to pink, red, or brown. Any of the following symptoms occur over the next 10 days: You have a temperature by mouth above 102 F (38.9 C), not controlled by medicine. Shortness of breath. Weakness after normal activity. The white part of the eye turns yellow (jaundice). You have a decrease in the amount of urine or are urinating less often. Your urine turns a dark color or changes to pink, red, or brown. Document Released: 01/02/2000 Document Revised: 03/29/2011 Document Reviewed: 08/21/2007 [facility] Patient Information 2014 [place],  [place].  _______________________________________________________________________

## 2022-03-11 ENCOUNTER — Encounter (HOSPITAL_COMMUNITY): Payer: Self-pay

## 2022-03-11 ENCOUNTER — Encounter (HOSPITAL_COMMUNITY)
Admission: RE | Admit: 2022-03-11 | Discharge: 2022-03-11 | Disposition: A | Discharge status: Home / Self Care | Payer: Managed Care, Other (non HMO) | Source: Ambulatory Visit | Attending: Orthopedic Surgery | Admitting: Orthopedic Surgery

## 2022-03-11 VITALS — BP 135/93 | HR 82 | Temp 98.4°F | Resp 12 | Ht 64.75 in | Wt 152.0 lb

## 2022-03-11 DIAGNOSIS — I10 Essential (primary) hypertension: Secondary | ICD-10-CM | POA: Insufficient documentation

## 2022-03-11 DIAGNOSIS — Z01818 Encounter for other preprocedural examination: Secondary | ICD-10-CM | POA: Insufficient documentation

## 2022-03-11 HISTORY — DX: Essential (primary) hypertension: I10

## 2022-03-11 HISTORY — DX: Pure hypercholesterolemia, unspecified: E78.00

## 2022-03-11 LAB — CBC
HCT: 39.3 % (ref 36.0–46.0)
Hemoglobin: 12.9 g/dL (ref 12.0–15.0)
MCH: 30.4 pg (ref 26.0–34.0)
MCHC: 32.8 g/dL (ref 30.0–36.0)
MCV: 92.7 fL (ref 80.0–100.0)
Platelets: 245 10*3/uL (ref 150–400)
RBC: 4.24 MIL/uL (ref 3.87–5.11)
RDW: 12.9 % (ref 11.5–15.5)
WBC: 4.9 10*3/uL (ref 4.0–10.5)
nRBC: 0 % (ref 0.0–0.2)

## 2022-03-11 LAB — BASIC METABOLIC PANEL
Anion gap: 6 (ref 5–15)
BUN: 19 mg/dL (ref 6–20)
CO2: 25 mmol/L (ref 22–32)
Calcium: 9.1 mg/dL (ref 8.9–10.3)
Chloride: 110 mmol/L (ref 98–111)
Creatinine, Ser: 0.83 mg/dL (ref 0.44–1.00)
GFR, Estimated: >60 mL/min (ref >60)
Glucose, Bld: 90 mg/dL (ref 70–99)
Potassium: 3.9 mmol/L (ref 3.5–5.1)
Sodium: 141 mmol/L (ref 135–145)

## 2022-03-11 LAB — TYPE AND SCREEN
ABO/RH(D): O NEG
Antibody Screen: NEGATIVE

## 2022-03-11 LAB — SURGICAL PCR SCREEN
MRSA, PCR: NEGATIVE
Staphylococcus aureus: NEGATIVE

## 2022-03-24 ENCOUNTER — Encounter (HOSPITAL_COMMUNITY)
Admission: RE | Disposition: A | Discharge status: Home / Self Care | Payer: Self-pay | Source: Ambulatory Visit | Attending: Orthopedic Surgery

## 2022-03-24 ENCOUNTER — Other Ambulatory Visit: Payer: Self-pay

## 2022-03-24 ENCOUNTER — Ambulatory Visit (HOSPITAL_COMMUNITY): Payer: Managed Care, Other (non HMO) | Admitting: Physician Assistant

## 2022-03-24 ENCOUNTER — Ambulatory Visit (HOSPITAL_BASED_OUTPATIENT_CLINIC_OR_DEPARTMENT_OTHER): Payer: Managed Care, Other (non HMO) | Admitting: Certified Registered Nurse Anesthetist

## 2022-03-24 ENCOUNTER — Observation Stay (HOSPITAL_COMMUNITY)
Admission: RE | Admit: 2022-03-24 | Discharge: 2022-03-25 | Disposition: A | Discharge status: Home / Self Care | Payer: Managed Care, Other (non HMO) | Source: Ambulatory Visit | Attending: Orthopedic Surgery | Admitting: Orthopedic Surgery

## 2022-03-24 ENCOUNTER — Encounter (HOSPITAL_COMMUNITY): Payer: Self-pay | Admitting: Orthopedic Surgery

## 2022-03-24 DIAGNOSIS — T84012A Broken internal right knee prosthesis, initial encounter: Secondary | ICD-10-CM

## 2022-03-24 DIAGNOSIS — Y828 Other medical devices associated with adverse incidents: Secondary | ICD-10-CM | POA: Diagnosis not present

## 2022-03-24 DIAGNOSIS — Z96653 Presence of artificial knee joint, bilateral: Secondary | ICD-10-CM | POA: Diagnosis not present

## 2022-03-24 DIAGNOSIS — I10 Essential (primary) hypertension: Secondary | ICD-10-CM | POA: Diagnosis not present

## 2022-03-24 DIAGNOSIS — Z79899 Other long term (current) drug therapy: Secondary | ICD-10-CM | POA: Diagnosis not present

## 2022-03-24 DIAGNOSIS — T84092A Other mechanical complication of internal right knee prosthesis, initial encounter: Secondary | ICD-10-CM | POA: Diagnosis present

## 2022-03-24 DIAGNOSIS — Z96659 Presence of unspecified artificial knee joint: Secondary | ICD-10-CM

## 2022-03-24 DIAGNOSIS — T84018A Broken internal joint prosthesis, other site, initial encounter: Secondary | ICD-10-CM

## 2022-03-24 HISTORY — PX: TOTAL KNEE REVISION: SHX996

## 2022-03-24 SURGERY — TOTAL KNEE REVISION
Anesthesia: Spinal | Site: Knee | Laterality: Right

## 2022-03-24 MED ORDER — ONDANSETRON HCL 4 MG/2ML IJ SOLN: 4.0000 mg | Freq: Four times a day (QID) | INTRAMUSCULAR | Status: DC | PRN

## 2022-03-24 MED ORDER — MENTHOL 3 MG MT LOZG: 1.0000 | LOZENGE | OROMUCOSAL | Status: DC | PRN

## 2022-03-24 MED ORDER — TRANEXAMIC ACID-NACL 1000-0.7 MG/100ML-% IV SOLN
1000.0000 mg | INTRAVENOUS | Status: AC
  Administered 2022-03-24: 1000 mg via INTRAVENOUS
  Filled 2022-03-24: qty 100

## 2022-03-24 MED ORDER — FENTANYL CITRATE PF 50 MCG/ML IJ SOSY: 100.0000 ug | PREFILLED_SYRINGE | Freq: Once | INTRAMUSCULAR | Status: AC

## 2022-03-24 MED ORDER — BACLOFEN 10 MG PO TABS
10.0000 mg | ORAL_TABLET | Freq: Three times a day (TID) | ORAL | Status: DC | PRN
  Administered 2022-03-24: 10 mg via ORAL
  Filled 2022-03-24: qty 1

## 2022-03-24 MED ORDER — MIDAZOLAM HCL 2 MG/2ML IJ SOLN
INTRAMUSCULAR | Status: AC
  Administered 2022-03-24: 2 mg
  Filled 2022-03-24: qty 2

## 2022-03-24 MED ORDER — ROPIVACAINE HCL 5 MG/ML IJ SOLN
INTRAMUSCULAR | Status: DC | PRN
  Administered 2022-03-24 (×6): 5 mL via PERINEURAL

## 2022-03-24 MED ORDER — ACETAMINOPHEN 10 MG/ML IV SOLN: 1000.0000 mg | Freq: Four times a day (QID) | INTRAVENOUS | Status: DC

## 2022-03-24 MED ORDER — SODIUM CHLORIDE 0.9 % IR SOLN
Status: DC | PRN
  Administered 2022-03-24: 3000 mL

## 2022-03-24 MED ORDER — POVIDONE-IODINE 10 % EX SWAB
2.0000 | Freq: Once | CUTANEOUS | Status: AC
  Administered 2022-03-24: 2 via TOPICAL

## 2022-03-24 MED ORDER — BISACODYL 10 MG RE SUPP: 10.0000 mg | Freq: Every day | RECTAL | Status: DC | PRN

## 2022-03-24 MED ORDER — PROPOFOL 1000 MG/100ML IV EMUL
INTRAVENOUS | Status: AC
  Filled 2022-03-24: qty 100

## 2022-03-24 MED ORDER — PROPOFOL 500 MG/50ML IV EMUL
INTRAVENOUS | Status: DC | PRN
  Administered 2022-03-24: 75 ug/kg/min via INTRAVENOUS

## 2022-03-24 MED ORDER — CHLORHEXIDINE GLUCONATE 0.12 % MT SOLN
15.0000 mL | Freq: Once | OROMUCOSAL | Status: AC
  Administered 2022-03-24: 15 mL via OROMUCOSAL

## 2022-03-24 MED ORDER — ASPIRIN 81 MG PO CHEW
81.0000 mg | CHEWABLE_TABLET | Freq: Two times a day (BID) | ORAL | Status: DC
  Administered 2022-03-25: 81 mg via ORAL
  Filled 2022-03-24: qty 1

## 2022-03-24 MED ORDER — DEXAMETHASONE SODIUM PHOSPHATE 10 MG/ML IJ SOLN
INTRAMUSCULAR | Status: DC | PRN
  Administered 2022-03-24: 10 mg

## 2022-03-24 MED ORDER — PROPOFOL 10 MG/ML IV BOLUS
INTRAVENOUS | Status: DC | PRN
  Administered 2022-03-24 (×2): 10 mg via INTRAVENOUS
  Administered 2022-03-24: 20 mg via INTRAVENOUS

## 2022-03-24 MED ORDER — ONDANSETRON HCL 4 MG/2ML IJ SOLN: 4.0000 mg | Freq: Once | INTRAMUSCULAR | Status: DC | PRN

## 2022-03-24 MED ORDER — CEFAZOLIN SODIUM-DEXTROSE 2-4 GM/100ML-% IV SOLN
2.0000 g | INTRAVENOUS | Status: AC
  Administered 2022-03-24: 2 g via INTRAVENOUS
  Filled 2022-03-24: qty 100

## 2022-03-24 MED ORDER — 0.9 % SODIUM CHLORIDE (POUR BTL) OPTIME
TOPICAL | Status: DC | PRN
  Administered 2022-03-24: 1000 mL

## 2022-03-24 MED ORDER — KETOROLAC TROMETHAMINE 30 MG/ML IJ SOLN: 30.0000 mg | Freq: Once | INTRAMUSCULAR | Status: DC | PRN

## 2022-03-24 MED ORDER — DIPHENHYDRAMINE HCL 12.5 MG/5ML PO ELIX: 12.5000 mg | ORAL_SOLUTION | ORAL | Status: DC | PRN

## 2022-03-24 MED ORDER — BUPIVACAINE LIPOSOME 1.3 % IJ SUSP: 20.0000 mL | Freq: Once | INTRAMUSCULAR | Status: DC

## 2022-03-24 MED ORDER — ONDANSETRON HCL 4 MG/2ML IJ SOLN
INTRAMUSCULAR | Status: DC | PRN
  Administered 2022-03-24: 4 mg via INTRAVENOUS

## 2022-03-24 MED ORDER — BUPIVACAINE LIPOSOME 1.3 % IJ SUSP
INTRAMUSCULAR | Status: AC
  Filled 2022-03-24: qty 20

## 2022-03-24 MED ORDER — DEXAMETHASONE SODIUM PHOSPHATE 10 MG/ML IJ SOLN
8.0000 mg | Freq: Once | INTRAMUSCULAR | Status: AC
  Administered 2022-03-24: 8 mg via INTRAVENOUS

## 2022-03-24 MED ORDER — OXYCODONE HCL 5 MG PO TABS
5.0000 mg | ORAL_TABLET | ORAL | Status: DC | PRN
  Administered 2022-03-24 – 2022-03-25 (×4): 10 mg via ORAL
  Filled 2022-03-24 (×4): qty 2

## 2022-03-24 MED ORDER — EPHEDRINE SULFATE-NACL 50-0.9 MG/10ML-% IV SOSY
PREFILLED_SYRINGE | INTRAVENOUS | Status: DC | PRN
  Administered 2022-03-24: 10 mg via INTRAVENOUS

## 2022-03-24 MED ORDER — ACETAMINOPHEN 500 MG PO TABS
1000.0000 mg | ORAL_TABLET | Freq: Four times a day (QID) | ORAL | Status: AC
  Administered 2022-03-24 – 2022-03-25 (×3): 1000 mg via ORAL
  Filled 2022-03-24 (×4): qty 2

## 2022-03-24 MED ORDER — ORAL CARE MOUTH RINSE: 15.0000 mL | Freq: Once | OROMUCOSAL | Status: AC

## 2022-03-24 MED ORDER — POLYETHYLENE GLYCOL 3350 17 G PO PACK: 17.0000 g | PACK | Freq: Every day | ORAL | Status: DC | PRN

## 2022-03-24 MED ORDER — CEFAZOLIN SODIUM-DEXTROSE 2-4 GM/100ML-% IV SOLN
2.0000 g | Freq: Four times a day (QID) | INTRAVENOUS | Status: AC
  Administered 2022-03-24 (×2): 2 g via INTRAVENOUS
  Filled 2022-03-24 (×2): qty 100

## 2022-03-24 MED ORDER — SODIUM CHLORIDE 0.9 % IV SOLN: INTRAVENOUS | Status: DC

## 2022-03-24 MED ORDER — SODIUM CHLORIDE (PF) 0.9 % IJ SOLN
INTRAMUSCULAR | Status: DC | PRN
  Administered 2022-03-24: 60 mL

## 2022-03-24 MED ORDER — ACETAMINOPHEN 160 MG/5ML PO SOLN: 325.0000 mg | ORAL | Status: DC | PRN

## 2022-03-24 MED ORDER — FENTANYL CITRATE PF 50 MCG/ML IJ SOSY
PREFILLED_SYRINGE | INTRAMUSCULAR | Status: AC
  Administered 2022-03-24: 100 ug
  Filled 2022-03-24: qty 2

## 2022-03-24 MED ORDER — ONDANSETRON HCL 4 MG PO TABS: 4.0000 mg | ORAL_TABLET | Freq: Four times a day (QID) | ORAL | Status: DC | PRN

## 2022-03-24 MED ORDER — GABAPENTIN 300 MG PO CAPS
300.0000 mg | ORAL_CAPSULE | Freq: Three times a day (TID) | ORAL | Status: DC
  Administered 2022-03-24 – 2022-03-25 (×2): 300 mg via ORAL
  Filled 2022-03-24 (×3): qty 1

## 2022-03-24 MED ORDER — MIDAZOLAM HCL 2 MG/2ML IJ SOLN
INTRAMUSCULAR | Status: AC
  Filled 2022-03-24: qty 2

## 2022-03-24 MED ORDER — OXYCODONE HCL 5 MG/5ML PO SOLN: 5.0000 mg | Freq: Once | ORAL | Status: DC | PRN

## 2022-03-24 MED ORDER — ROSUVASTATIN CALCIUM 5 MG PO TABS
5.0000 mg | ORAL_TABLET | Freq: Every day | ORAL | Status: DC
  Administered 2022-03-25: 5 mg via ORAL
  Filled 2022-03-24: qty 1

## 2022-03-24 MED ORDER — BUPIVACAINE LIPOSOME 1.3 % IJ SUSP
INTRAMUSCULAR | Status: DC | PRN
  Administered 2022-03-24: 20 mL

## 2022-03-24 MED ORDER — PHENOL 1.4 % MT LIQD: 1.0000 | OROMUCOSAL | Status: DC | PRN

## 2022-03-24 MED ORDER — FENTANYL CITRATE (PF) 100 MCG/2ML IJ SOLN
INTRAMUSCULAR | Status: AC
  Filled 2022-03-24: qty 2

## 2022-03-24 MED ORDER — STERILE WATER FOR IRRIGATION IR SOLN
Status: DC | PRN
  Administered 2022-03-24: 2000 mL

## 2022-03-24 MED ORDER — LACTATED RINGERS IV SOLN: INTRAVENOUS | Status: DC

## 2022-03-24 MED ORDER — DEXAMETHASONE SODIUM PHOSPHATE 10 MG/ML IJ SOLN
10.0000 mg | Freq: Once | INTRAMUSCULAR | Status: AC
  Administered 2022-03-25: 10 mg via INTRAVENOUS
  Filled 2022-03-24: qty 1

## 2022-03-24 MED ORDER — PROPOFOL 10 MG/ML IV BOLUS
INTRAVENOUS | Status: AC
  Filled 2022-03-24: qty 20

## 2022-03-24 MED ORDER — SODIUM CHLORIDE (PF) 0.9 % IJ SOLN
INTRAMUSCULAR | Status: AC
  Filled 2022-03-24: qty 10

## 2022-03-24 MED ORDER — DOCUSATE SODIUM 100 MG PO CAPS
100.0000 mg | ORAL_CAPSULE | Freq: Two times a day (BID) | ORAL | Status: DC
  Administered 2022-03-24 – 2022-03-25 (×3): 100 mg via ORAL
  Filled 2022-03-24 (×3): qty 1

## 2022-03-24 MED ORDER — MIDAZOLAM HCL 2 MG/2ML IJ SOLN: 2.0000 mg | Freq: Once | INTRAMUSCULAR | Status: AC

## 2022-03-24 MED ORDER — ACETAMINOPHEN 325 MG PO TABS: 325.0000 mg | ORAL_TABLET | ORAL | Status: DC | PRN

## 2022-03-24 MED ORDER — MORPHINE SULFATE (PF) 2 MG/ML IV SOLN
1.0000 mg | INTRAVENOUS | Status: DC | PRN
  Administered 2022-03-24 – 2022-03-25 (×3): 2 mg via INTRAVENOUS
  Filled 2022-03-24 (×3): qty 1

## 2022-03-24 MED ORDER — CLONIDINE HCL (ANALGESIA) 100 MCG/ML EP SOLN
EPIDURAL | Status: DC | PRN
  Administered 2022-03-24: 100 ug

## 2022-03-24 MED ORDER — OXYCODONE HCL 5 MG PO TABS: 5.0000 mg | ORAL_TABLET | Freq: Once | ORAL | Status: DC | PRN

## 2022-03-24 MED ORDER — METOCLOPRAMIDE HCL 5 MG PO TABS: 5.0000 mg | ORAL_TABLET | Freq: Three times a day (TID) | ORAL | Status: DC | PRN

## 2022-03-24 MED ORDER — METOCLOPRAMIDE HCL 5 MG/ML IJ SOLN: 5.0000 mg | Freq: Three times a day (TID) | INTRAMUSCULAR | Status: DC | PRN

## 2022-03-24 MED ORDER — TRAMADOL HCL 50 MG PO TABS: 50.0000 mg | ORAL_TABLET | Freq: Four times a day (QID) | ORAL | Status: DC | PRN

## 2022-03-24 MED ORDER — MEPERIDINE HCL 50 MG/ML IJ SOLN: 6.2500 mg | INTRAMUSCULAR | Status: DC | PRN

## 2022-03-24 MED ORDER — SODIUM CHLORIDE (PF) 0.9 % IJ SOLN
INTRAMUSCULAR | Status: AC
  Filled 2022-03-24: qty 50

## 2022-03-24 MED ORDER — BUPIVACAINE IN DEXTROSE 0.75-8.25 % IT SOLN
INTRATHECAL | Status: DC | PRN
  Administered 2022-03-24: 1.6 mL via INTRATHECAL

## 2022-03-24 MED ORDER — FENTANYL CITRATE PF 50 MCG/ML IJ SOSY: 25.0000 ug | PREFILLED_SYRINGE | INTRAMUSCULAR | Status: DC | PRN

## 2022-03-24 MED ORDER — TOPIRAMATE 25 MG PO TABS
50.0000 mg | ORAL_TABLET | Freq: Two times a day (BID) | ORAL | Status: DC
  Administered 2022-03-25: 50 mg via ORAL
  Filled 2022-03-24: qty 2

## 2022-03-24 MED ORDER — PHENYLEPHRINE HCL-NACL 20-0.9 MG/250ML-% IV SOLN
INTRAVENOUS | Status: DC | PRN
  Administered 2022-03-24: 20 ug/min via INTRAVENOUS

## 2022-03-24 MED ORDER — FLEET ENEMA 7-19 GM/118ML RE ENEM: 1.0000 | ENEMA | Freq: Once | RECTAL | Status: DC | PRN

## 2022-03-24 SURGICAL SUPPLY — 66 items
AUG FEM SZ5 4 REV POST STRL LF (Miscellaneous) ×2 IMPLANT
AUGMENT POST FEM SZ5 4 KNEE (Miscellaneous) IMPLANT
BAG COUNTER SPONGE SURGICOUNT (BAG) IMPLANT
BAG SPEC THK2 15X12 ZIP CLS (MISCELLANEOUS)
BAG SPNG CNTER NS LX DISP (BAG)
BAG ZIPLOCK 12X15 (MISCELLANEOUS) IMPLANT
BLADE SAG 18X100X1.27 (BLADE) ×1 IMPLANT
BLADE SAW SGTL 11.0X1.19X90.0M (BLADE) ×1 IMPLANT
BLADE SURG SZ10 CARB STEEL (BLADE) IMPLANT
BNDG CMPR 5X62 HK CLSR LF (GAUZE/BANDAGES/DRESSINGS) ×1
BNDG CMPR MED 15X6 ELC VLCR LF (GAUZE/BANDAGES/DRESSINGS) ×1
BNDG ELASTIC 6INX 5YD STR LF (GAUZE/BANDAGES/DRESSINGS) ×1 IMPLANT
BNDG ELASTIC 6X15 VLCR STRL LF (GAUZE/BANDAGES/DRESSINGS) IMPLANT
BONE CEMENT GENTAMICIN (Cement) ×3 IMPLANT
BSPLAT TIB 5 CMNT REV ROT PLAT (Orthopedic Implant) ×1 IMPLANT
CEMENT BONE GENTAMICIN 40 (Cement) ×3 IMPLANT
COMP FEM ATTUNE CRS CEM RT SZ5 (Femur) ×1 IMPLANT
COMPONENT FEM ATN CR CEM RTSZ5 (Femur) IMPLANT
COVER SURGICAL LIGHT HANDLE (MISCELLANEOUS) ×1 IMPLANT
CUFF TOURN SGL QUICK 34 (TOURNIQUET CUFF) ×1
CUFF TRNQT CYL 34X4.125X (TOURNIQUET CUFF) ×1 IMPLANT
DRAPE U-SHAPE 47X51 STRL (DRAPES) ×1 IMPLANT
DRSG AQUACEL AG ADV 3.5X10 (GAUZE/BANDAGES/DRESSINGS) ×1 IMPLANT
DURAPREP 26ML APPLICATOR (WOUND CARE) ×1 IMPLANT
ELECT REM PT RETURN 15FT ADLT (MISCELLANEOUS) ×1 IMPLANT
GLOVE BIO SURGEON STRL SZ 6.5 (GLOVE) IMPLANT
GLOVE BIO SURGEON STRL SZ7 (GLOVE) IMPLANT
GLOVE BIO SURGEON STRL SZ8 (GLOVE) ×2 IMPLANT
GLOVE BIOGEL PI IND STRL 7.0 (GLOVE) IMPLANT
GLOVE BIOGEL PI IND STRL 8 (GLOVE) ×1 IMPLANT
GOWN STRL REUS W/ TWL LRG LVL3 (GOWN DISPOSABLE) ×1 IMPLANT
GOWN STRL REUS W/TWL LRG LVL3 (GOWN DISPOSABLE) ×1
HANDPIECE INTERPULSE COAX TIP (DISPOSABLE) ×1
HOLDER FOLEY CATH W/STRAP (MISCELLANEOUS) IMPLANT
IMMOBILIZER KNEE 20 (SOFTGOODS) ×1
IMMOBILIZER KNEE 20 THIGH 36 (SOFTGOODS) ×1 IMPLANT
IMMOBILIZER KNEE 22 UNIV (SOFTGOODS) IMPLANT
INSERT TIB CRS ATTUNE SZ5 16 (Insert) IMPLANT
KIT TURNOVER KIT A (KITS) IMPLANT
MANIFOLD NEPTUNE II (INSTRUMENTS) ×1 IMPLANT
NS IRRIG 1000ML POUR BTL (IV SOLUTION) ×1 IMPLANT
PACK TOTAL KNEE CUSTOM (KITS) ×1 IMPLANT
PADDING CAST COTTON 6X4 STRL (CAST SUPPLIES) ×2 IMPLANT
PIN STEINMAN FIXATION KNEE (PIN) IMPLANT
PLATE REV TIB BAS ROT SZ5 KNEE (Orthopedic Implant) IMPLANT
PROTECTOR NERVE ULNAR (MISCELLANEOUS) ×1 IMPLANT
REV TIB BASE ROT PLAT SZ5 KNEE (Orthopedic Implant) ×1 IMPLANT
SET HNDPC FAN SPRY TIP SCT (DISPOSABLE) ×1 IMPLANT
SLEEVE TIB ATTUNE FP 37 (Knees) IMPLANT
SPIKE FLUID TRANSFER (MISCELLANEOUS) IMPLANT
STEM STR ATTUNE PF 16X110 (Knees) IMPLANT
STEM STR ATTUNE PF 16X60 (Knees) IMPLANT
STRIP CLOSURE SKIN 1/2X4 (GAUZE/BANDAGES/DRESSINGS) ×2 IMPLANT
SUT MNCRL AB 4-0 PS2 18 (SUTURE) ×1 IMPLANT
SUT STRATAFIX 0 PDS 27 VIOLET (SUTURE) ×1
SUT VIC AB 2-0 CT1 27 (SUTURE) ×3
SUT VIC AB 2-0 CT1 TAPERPNT 27 (SUTURE) ×3 IMPLANT
SUTURE STRATFX 0 PDS 27 VIOLET (SUTURE) ×1 IMPLANT
SWAB COLLECTION DEVICE MRSA (MISCELLANEOUS) IMPLANT
SWAB CULTURE ESWAB REG 1ML (MISCELLANEOUS) IMPLANT
TOWER CARTRIDGE SMART MIX (DISPOSABLE) ×1 IMPLANT
TRAY FOLEY MTR SLVR 16FR STAT (SET/KITS/TRAYS/PACK) ×1 IMPLANT
TUBE KAMVAC SUCTION (TUBING) IMPLANT
TUBE SUCTION HIGH CAP CLEAR NV (SUCTIONS) ×1 IMPLANT
WATER STERILE IRR 1000ML POUR (IV SOLUTION) ×1 IMPLANT
WRAP KNEE MAXI GEL POST OP (GAUZE/BANDAGES/DRESSINGS) IMPLANT

## 2022-03-24 NOTE — Anesthesia Preprocedure Evaluation (Signed)
Anesthesia Evaluation  Patient identified by MRN, date of birth, ID band Patient awake    Reviewed: Allergy & Precautions, NPO status   History of Anesthesia Complications (+) POST - OP SPINAL HEADACHE and history of anesthetic complications  Airway Mallampati: I       Dental no notable dental hx.    Pulmonary    Pulmonary exam normal        Cardiovascular Normal cardiovascular exam     Neuro/Psych    GI/Hepatic Neg liver ROS,,,  Endo/Other  negative endocrine ROS    Renal/GU negative Renal ROS  Female GU complaint     Musculoskeletal  (+) Arthritis , Osteoarthritis,    Abdominal Normal abdominal exam  (+)   Peds  Hematology negative hematology ROS (+)   Anesthesia Other Findings   Reproductive/Obstetrics                             Anesthesia Physical Anesthesia Plan  ASA: 2  Anesthesia Plan: Spinal   Post-op Pain Management:    Induction:   PONV Risk Score and Plan: 2 and Ondansetron, Dexamethasone and Midazolam  Airway Management Planned: Natural Airway and Simple Face Mask  Additional Equipment: None  Intra-op Plan:   Post-operative Plan:   Informed Consent: I have reviewed the patients History and Physical, chart, labs and discussed the procedure including the risks, benefits and alternatives for the proposed anesthesia with the patient or authorized representative who has indicated his/her understanding and acceptance.       Plan Discussed with: CRNA  Anesthesia Plan Comments:        Anesthesia Quick Evaluation

## 2022-03-24 NOTE — Anesthesia Procedure Notes (Signed)
Spinal  Patient location during procedure: OR Start time: 03/24/2022 8:24 AM End time: 03/24/2022 8:30 AM Reason for block: surgical anesthesia Staffing Performed: anesthesiologist  Anesthesiologist: Lyn Hollingshead, MD Performed by: Lyn Hollingshead, MD Authorized by: Lyn Hollingshead, MD   Preanesthetic Checklist Completed: patient identified, IV checked, site marked, risks and benefits discussed, surgical consent, monitors and equipment checked, pre-op evaluation and timeout performed Spinal Block Patient position: sitting Prep: DuraPrep and site prepped and draped Patient monitoring: continuous pulse ox and blood pressure Approach: midline Location: L3-4 Injection technique: single-shot Needle Needle type: Pencan  Needle gauge: 24 G Needle length: 10 cm Needle insertion depth: 6 cm Assessment Sensory level: T8 Events: CSF return

## 2022-03-24 NOTE — Interval H&P Note (Signed)
History and Physical Interval Note:  03/24/2022 6:50 AM  Ashley [name]  has presented today for surgery, with the diagnosis of failed right total knee arthroplasty.  The various methods of treatment have been discussed with the patient and family. After consideration of risks, benefits and other options for treatment, the patient has consented to  Procedure(s): TOTAL KNEE REVISION (Right) as a surgical intervention.  The patient's history has been reviewed, patient examined, no change in status, stable for surgery.  I have reviewed the patient's chart and labs.  Questions were answered to the patient's satisfaction.     Pilar Plate Maaz Spiering

## 2022-03-24 NOTE — Progress Notes (Signed)
Orthopedic Tech Progress Note Patient Details:  VIRL DOAKES 07-03-1965 [id number]  Patient ID: Nydia Bouton, female   DOB: 1965-05-14, 57 y.o.   MRN: [id number]  Kennis Carina 03/24/2022, 11:03 AM Cpm applied in pacu

## 2022-03-24 NOTE — Op Note (Unsigned)
NAMEJETT, STANG MEDICAL RECORD NO: [id number] ACCOUNT NO: [id number] DATE OF BIRTH: 01/03/1966 FACILITY: [facility] LOCATION: [facility] PHYSICIAN: Dione Plover. Gianpaolo Mindel, MD  Operative Report   DATE OF PROCEDURE: 03/24/2022  PREOPERATIVE DIAGNOSIS:  Failed right total knee arthroplasty.  POSTOPERATIVE DIAGNOSIS:  Failed right total knee arthroplasty.  PROCEDURE:  Right total knee arthroplasty revision.  SURGEON:  Dione Plover. Kenon Delashmit, MD  ASSISTANT:  Shearon Balo, PA-C.  ANESTHESIA:  Adductor canal block and spinal.  ESTIMATED BLOOD LOSS:  100.  TOURNIQUET TIME:  79 minutes at 300 mmHg.  COMPLICATIONS:  None.  CONDITION:  Stable to recovery.  BRIEF CLINICAL NOTE:  The patient is a 57 year old female who had a bilateral total knee arthroplasties done less than 10 years ago and has had progressively worsening pain and dysfunction in the right worse than left knee.  She has a combination of  instability as well as scar tissue with limited motion, but instability within that limited range.  Workup for infection was negative.  She had a bone scan, which suggested a possible loosening of the femoral and tibial components.  She presents now for  total knee arthroplasty revision.  DESCRIPTION OF PROCEDURE:  After successful administration of adductor canal block and spinal anesthetic, a tourniquet was placed on her right thigh and her right lower extremity was prepped and draped in the usual sterile fashion.  Extremity was wrapped  in Esmarch and tourniquet inflated to 300 mmHg.  Midline incision was made with a 10 blade through subcutaneous tissue to the level of the extensor mechanism.  Fresh blade was used to make a medial parapatellar arthrotomy.  Soft tissue on the proximal  medial tibia subperiosteally elevated in the joint line with a knife and into the semimembranosus bursa with a Cobb elevator.  Scar tissue was then excised from under the extensor mechanism starting medially.  We then  did that laterally and attention was  being paid to avoiding the patellar tendon on tibial tubercle.  After excising the scar was able to evert the patella and flex the knee 90 degrees.  The patella component was well fixed and did not show any wear thus, it was left intact.  I subluxed the tibia forward and remove the tibial polyethylene.  Circumferential retractions placed around the tibial component.  There was bone overgrowing the posterior aspect of the component and I removed that bone to expose the tibial tray.   Oscillating saw was used to disrupt the interface between the tibial tray and bone.  Osteotomes were then used to complete the dissociation between the proximal tray and bone.  A bone tamp was then used to extract the tibial component, which was removed  relatively easily.  The cement was then removed from the tibial canal.  We then thoroughly irrigated the tibial canal.  I reamed up to 16 mm for placement of a 16 x 60 stem and a 16 had a very tight press fit.  Removed the reamer.  The extramedullary  alignment guide was then placed on the tibia referencing proximally at the medial aspect of the tibial tubercle and distally along the second metatarsal axis and tibial crest.  Block is pinned to remove about 2 mm off the previous resection level.   Resection was made with an oscillating saw.  Size 5 was the most appropriate size for the Attune revision.  We then prepared proximally with the 29 then 38 broach for the sleeve.  With the 38 we had excellent rotational stability.  This completed the  preparation of the tibia.  We then prepared the femur.  I used an osteotome to disrupt the interface between the femoral component and bone.  With minimal disruption the component came off easily consistent with a loose component.  We then accessed the femoral canal and thoroughly  irrigated it.  I reamed up to 16 mm for a 16 x 110 stem.  We placed a distal femoral cutting block off the reamer, which  that reamer served as our alignment guide.  A minimal resection was made off the distal femur.  Size 5 was also most appropriate for  the femoral component.  Distal femoral cutting block is placed and rotation is marked off the epicondylar axis and also confirmed by creating a rectangular flexion space with spacers in place.  Rotation was marked in the cutting block and the block is  pinned.  Minimal resection was made anterior and slightly more posterior.  The chamfers did not have any resection.  We then placed the intercondylar block and the intercondylar cut was made for the constrained component.  Once completed, we placed the  femoral trial, which is a size 5 Attune revision femur with 4 mm augments posteriorly, both medial and lateral and then a 16 x 110 stem extension.  This was impacted with excellent fit.  On the tibial side, it was a size 5 Attune revision tibia with a 38  sleeve and a 16 x 60 stem extension.  The trials were placed and with a 14 mm insert, full extension was achieved with excellent balance throughout full range of motion.  Patella tracked normally.  The trials were removed and then the permanent components were assembled on the back table.  Once that was completed, then the cut bone surfaces were copiously irrigated with saline solution using pulsatile lavage.  Three batches of cement are mixed and  once ready for implantation, we had the press-fit tibial stem and porous coated sleeve.  Thus, we did not need to put cement down in the canal.  We just put cement up on the proximal tibia and just underneath the tibial baseplate.  The tibial component  was impacted with excellent fit and then all extruded cement was removed.  On the femoral side, we had the press-fit stem in the canal and cemented distally.  Cement was placed and then the femoral component was impacted and all extruded cement removed.   With the 14 trial insert there was a tiny bit of varus, valgus play, so I went  to 16, which allowed for full extension with excellent varus, valgus, and anterior, posterior balance throughout full range of motion.  The 20 mL of Exparel mixed with 60 mL  of saline that were then injected into the posterior capsule. The extensor mechanism subcutaneous tissues.  The trial inserts were removed and the permanent 16 mm Attune constrained rotating platform posterior stabilized insert was placed in the tibial  tray.  The knee was reduced and the knee, placed through range of motion with outstanding stability throughout.  Wound was again copiously irrigated with saline solution and the arthrotomy then closed with a running 0 Stratafix suture.  The flexion  against gravity was about 135 degrees.  The patella tracks normally.  The tourniquet was released and for a total time of 79 minutes.  Minimal bleeding was encountered and that which is encountered was stopped with electrocautery.  Subcutaneous was then  closed with interrupted 2-0 Vicryl and subcuticular running 4-0  Monocryl.  The incision was cleaned and dried and Steri-Strips and a bulky sterile dressing applied.  She was then awakened and transported to recovery in stable condition.  Note that a surgical assistant was of medical necessity for this procedure to do it in a safe and expeditious manner.  Surgical assistant was necessary for retraction of vital ligaments and neurovascular structures and for proper positioning of the limb  for safe removal of the old implant and safe and accurate placement of the new implant.   PUS D: 03/24/2022 10:28:44 am T: 03/24/2022 10:51:00 am  JOB: [id number]

## 2022-03-24 NOTE — Progress Notes (Signed)
Orthopedic Tech Progress Note Patient Details:  Ashley [name] 10/25/1965 [id number]  Patient ID: Nydia Bouton, female   DOB: [birth year], 57 y.o.   MRN: [id number]  Kennis Carina 03/24/2022, 3:37 PM Cpm removed

## 2022-03-24 NOTE — Anesthesia Postprocedure Evaluation (Signed)
Anesthesia Post Note  Patient: Ashley [name]  Procedure(s) Performed: TOTAL KNEE REVISION (Right: Knee)     Patient location during evaluation: PACU Anesthesia Type: Spinal Level of consciousness: awake Pain management: pain level controlled Vital Signs Assessment: post-procedure vital signs reviewed and stable Respiratory status: spontaneous breathing Cardiovascular status: stable Postop Assessment: no headache, no backache, spinal receding, patient able to bend at knees and no apparent nausea or vomiting Anesthetic complications: no  No notable events documented.  Last Vitals:  Vitals:   03/24/22 1130 03/24/22 1145  BP: 104/67 105/71  Pulse: 75 86  Resp: 19 18  Temp:    SpO2: 98% 98%    Last Pain:  Vitals:   03/24/22 1145  TempSrc:   PainSc: 0-No pain                 Huston Foley

## 2022-03-24 NOTE — Addendum Note (Signed)
Addendum  created 03/24/22 1411 by Montel Clock, CRNA   Clinical Note Signed

## 2022-03-24 NOTE — Transfer of Care (Addendum)
Immediate Anesthesia Transfer of Care Note  Patient: Ashley [name]  Procedure(s) Performed: TOTAL KNEE REVISION (Right: Knee)  Patient Location: PACU  Anesthesia Type:Spinal  Level of Consciousness: drowsy and patient cooperative  Airway & Oxygen Therapy: Patient Spontanous Breathing and Patient connected to face mask oxygen  Post-op Assessment: Report given to RN and Post -op Vital signs reviewed and stable  Post vital signs: Reviewed and stable  Last Vitals:  Vitals Value Taken Time  BP 113/75 03/24/22 1051  Temp    Pulse 86 03/24/22 1052  Resp 21 03/24/22 1052  SpO2 100 % 03/24/22 1052  Vitals shown include unvalidated device data.  Last Pain:  Vitals:   03/24/22 0655  TempSrc: Oral  PainSc:          Complications: No notable events documented.

## 2022-03-24 NOTE — Anesthesia Procedure Notes (Signed)
Anesthesia Regional Block: Adductor canal block   Pre-Anesthetic Checklist: , timeout performed,  Correct Patient, Correct Site, Correct Laterality,  Correct Procedure, Correct Position, site marked,  Risks and benefits discussed,  Surgical consent,  Pre-op evaluation,  At surgeon's request and post-op pain management  Laterality: Lower and Right  Prep: chloraprep       Needles:  Injection technique: Single-shot  Needle Type: Echogenic Needle     Needle Length: 9cm  Needle Gauge: 20   Needle insertion depth: 3 cm   Additional Needles:   Procedures:,,,, ultrasound used (permanent image in chart),,    Narrative:  Start time: 03/24/2022 8:04 AM End time: 03/24/2022 8:11 AM Injection made incrementally with aspirations every 5 mL.  Performed by: Personally  Anesthesiologist: Lyn Hollingshead, MD

## 2022-03-24 NOTE — Plan of Care (Signed)
  Problem: Education: Goal: Knowledge of the prescribed therapeutic regimen will improve Outcome: Progressing   Problem: Pain Management: Goal: Pain level will decrease with appropriate interventions Outcome: Progressing   Problem: Activity: Goal: Ability to avoid complications of mobility impairment will improve Outcome: Progressing   

## 2022-03-24 NOTE — Evaluation (Signed)
Physical Therapy Evaluation Patient Details Name: Ashley [name] MRN: [id number] DOB: [birth year] Today's Date: 03/24/2022  History of Present Illness  57 yo female presents to therapy s/p R TKA on 03/24/2022 due to failed R TKA (2017). Pt pmh includes but is not limited to: anemia, arthritis, and B TKA (2017).  Clinical Impression    Ashley [name] is a 57 y.o. female POD 0 s/p R TKA revision.  Patient reports IND with mobility at baseline. Patient is now limited by functional impairments (see PT problem list below) and requires min guard for bed mobility and for transfers. Patient was able to ambulate 57 feet with RW and min guard level of assist. Patient instructed in exercise to facilitate ROM and circulation to manage edema. Patient will benefit from continued skilled PT interventions to address impairments and progress towards PLOF. Acute PT will follow to progress mobility and stair training in preparation for safe discharge home.      Recommendations for follow up therapy are one component of a multi-disciplinary discharge planning process, led by the attending physician.  Recommendations may be updated based on patient status, additional functional criteria and insurance authorization.  Follow Up Recommendations Outpatient PT (starting 3/11)      Assistance Recommended at Discharge Frequent or constant Supervision/Assistance  Patient can return home with the following  A little help with walking and/or transfers;A little help with bathing/dressing/bathroom;Assistance with cooking/housework;Assist for transportation;Help with stairs or ramp for entrance    Equipment Recommendations None recommended by PT (pt reports having DME in home settin g)  Recommendations for Other Services       Functional Status Assessment Patient has had a recent decline in their functional status and demonstrates the ability to make significant improvements in function in a reasonable and predictable amount of  time.     Precautions / Restrictions Precautions Precautions: Knee;Fall Restrictions Weight Bearing Restrictions: No      Mobility  Bed Mobility Overal bed mobility: Needs Assistance Bed Mobility: Supine to Sit     Supine to sit: Min guard     General bed mobility comments: HOB elevated and increased time    Transfers Overall transfer level: Needs assistance   Transfers: Sit to/from Stand Sit to Stand: Min guard           General transfer comment: cues for proper UE placement    Ambulation/Gait Ambulation/Gait assistance: Min guard Gait Distance (Feet): 57 Feet Assistive device: Rolling walker (2 wheels) Gait Pattern/deviations: Step-to pattern Gait velocity: decreased     General Gait Details: B UE WB to offload R in stance phase  Stairs            Wheelchair Mobility    Modified Rankin (Stroke Patients Only)       Balance Overall balance assessment: Needs assistance Sitting-balance support: Feet unsupported, No upper extremity supported Sitting balance-Leahy Scale: Fair     Standing balance support: Reliant on assistive device for balance Standing balance-Leahy Scale: Poor                               Pertinent Vitals/Pain Pain Assessment Pain Assessment: 0-10 Pain Score: 3  Pain Location: R knee Pain Descriptors / Indicators: Aching, Tightness, Operative site guarding, Constant Pain Intervention(s): Limited activity within patient's tolerance, Monitored during session, Repositioned, Premedicated before session, Ice applied    Home Living Family/patient expects to be discharged to:: Private residence Living Arrangements: Alone Available Help  at Discharge: Family;Available 24 hours/day Type of Home: House Home Access: Stairs to enter Entrance Stairs-Rails: None Entrance Stairs-Number of Steps: 2   Home Layout: Two level;Able to live on main level with bedroom/bathroom Home Equipment: Rolling Walker (2 wheels);Cane -  single point;BSC/3in1;Shower seat      Prior Function Prior Level of Function : Independent/Modified Independent;Driving;Working/employed             Mobility Comments: IND with all ADLs, self care tasks, IADLs and driving       Hand Dominance        Extremity/Trunk Assessment        Lower Extremity Assessment Lower Extremity Assessment: RLE deficits/detail RLE Deficits / Details: ankle DF/PF 4+/5 pt required AA for SLR initally and able to progress to A RLE Sensation: WNL       Communication   Communication: No difficulties  Cognition Arousal/Alertness: Awake/alert Behavior During Therapy: WFL for tasks assessed/performed Overall Cognitive Status: Within Functional Limits for tasks assessed                                          General Comments      Exercises Total Joint Exercises Ankle Circles/Pumps: AROM, Both, 20 reps Quad Sets: AROM, Right, 5 reps Straight Leg Raises: AROM, Right, 5 reps   Assessment/Plan    PT Assessment Patient needs continued PT services  PT Problem List Decreased strength;Decreased range of motion;Decreased activity tolerance;Decreased balance;Decreased mobility;Decreased coordination;Pain       PT Treatment Interventions DME instruction;Gait training;Stair training;Functional mobility training;Therapeutic activities;Therapeutic exercise;Balance training;Neuromuscular re-education;Patient/family education;Modalities    PT Goals (Current goals can be found in the Care Plan section)  Acute Rehab PT Goals Patient Stated Goal: decrease pain, ride a bike and walk down steps foward and not look like a granny PT Goal Formulation: With patient Time For Goal Achievement: 04/07/22 Potential to Achieve Goals: Good    Frequency 7X/week     Co-evaluation               AM-PAC PT "6 Clicks" Mobility  Outcome Measure Help needed turning from your back to your side while in a flat bed without using bedrails?: A  Little Help needed moving from lying on your back to sitting on the side of a flat bed without using bedrails?: A Little Help needed moving to and from a bed to a chair (including a wheelchair)?: A Little Help needed standing up from a chair using your arms (e.g., wheelchair or bedside chair)?: A Little Help needed to walk in hospital room?: A Little Help needed climbing 3-5 steps with a railing? : Total 6 Click Score: 16    End of Session Equipment Utilized During Treatment: Gait belt Activity Tolerance: Patient tolerated treatment well;No increased pain Patient left: in chair;with call bell/phone within reach;with chair alarm set Nurse Communication: Mobility status;Patient requests pain meds (pt initially requested pain medication with nurse reporting pt had pain medication 1 hr prior to PT eval. pt indicated that her R knee pain did not increase with activity) PT Visit Diagnosis: Unsteadiness on feet (R26.81);Other abnormalities of gait and mobility (R26.89);Muscle weakness (generalized) (M62.81);Pain;Difficulty in walking, not elsewhere classified (R26.2) Pain - Right/Left: Right Pain - part of body: Knee    Time: [id number] PT Time Calculation (min) (ACUTE ONLY): 33 min   Charges:   PT Evaluation $PT Eval Low Complexity: 1 Low PT Treatments $  Gait Training: 8-22 mins      Baird Lyons, PT   Adair Patter 03/24/2022, 5:34 PM

## 2022-03-24 NOTE — Discharge Instructions (Signed)
Ashley Arabian, MD Total Joint Specialist [facility] [address], [address] [place], [place] [address] [phone]  TOTAL KNEE ARTHROPLASTY REVISION POSTOPERATIVE DIRECTIONS    Knee Rehabilitation, Guidelines Following Surgery  Results after knee surgery are often greatly improved when you follow the exercise, range of motion and muscle strengthening exercises prescribed by your doctor. Safety measures are also important to protect the knee from further injury. If any of these exercises cause you to have increased pain or swelling in your knee joint, decrease the amount until you are comfortable again and slowly increase them. If you have problems or questions, call your caregiver or physical therapist for advice.   BLOOD CLOT PREVENTION Take 81 mg Aspirin two times a day for three weeks following surgery. Then take an 81 mg Aspirin once a day for three weeks. Then discontinue Aspirin. You may resume your vitamins/supplements upon discharge from the hospital. Do not take any NSAIDs (Advil, Aleve, Ibuprofen, Meloxicam, etc.) for 3 weeks, while taking '81mg'$  Aspirin twice a day.   HOME CARE INSTRUCTIONS  Remove items at home which could result in a fall. This includes throw rugs or furniture in walking pathways.  ICE to the affected knee as much as tolerated. Icing helps control swelling. If the swelling is well controlled you will be more comfortable and rehab easier. Continue to use ice on the knee for pain and swelling from surgery. You may notice swelling that will progress down to the foot and ankle. This is normal after surgery. Elevate the leg when you are not up walking on it.    Continue to use the breathing machine which will help keep your temperature down. It is common for your temperature to cycle up and down following surgery, especially at night when you are not up moving around and exerting yourself. The breathing machine keeps your lungs expanded and your  temperature down. Do not place pillow under the operative knee, focus on keeping the knee straight while resting  DIET You may resume your previous home diet once you are discharged from the hospital.  DRESSING / WOUND CARE / SHOWERING Keep your bulky bandage on for 2 days. On the third post-operative day you may remove the Ace bandage and gauze. There is a waterproof adhesive bandage on your skin which will stay in place until your first follow-up appointment. Once you remove this you will not need to place another bandage You may begin showering 3 days following surgery, but do not submerge the incision under water.  ACTIVITY For the first 5 days, the key is rest and control of pain and swelling Do your home exercises twice a day starting on post-operative day 3. On the days you go to physical therapy, just do the home exercises once that day. You should rest, ice and elevate the leg for 50 minutes out of every hour. Get up and walk/stretch for 10 minutes per hour. After 5 days you can increase your activity slowly as tolerated. Walk with your walker as instructed. Use the walker until you are comfortable transitioning to a cane. Walk with the cane in the opposite hand of the operative leg. You may discontinue the cane once you are comfortable and walking steadily. Avoid periods of inactivity such as sitting longer than an hour when not asleep. This helps prevent blood clots.  You may discontinue the knee immobilizer once you are able to perform a straight leg raise while lying down. You may resume a sexual relationship  in one month or when given the OK by your doctor.  You may return to work once you are cleared by your doctor.  Do not drive a car for 6 weeks or until released by your surgeon.  Do not drive while taking narcotics.  TED HOSE STOCKINGS Wear the elastic stockings on both legs for three weeks following surgery during the day. You may remove them at night for sleeping.  WEIGHT  BEARING Weight bearing as tolerated with assist device (walker, cane, etc) as directed, use it as long as suggested by your surgeon or therapist, typically at least 4-6 weeks.  POSTOPERATIVE CONSTIPATION PROTOCOL Constipation - defined medically as fewer than three stools per week and severe constipation as less than one stool per week.  One of the most common issues patients have following surgery is constipation.  Even if you have a regular bowel pattern at home, your normal regimen is likely to be disrupted due to multiple reasons following surgery.  Combination of anesthesia, postoperative narcotics, change in appetite and fluid intake all can affect your bowels.  In order to avoid complications following surgery, here are some recommendations in order to help you during your recovery period.  Colace (docusate) - Pick up an over-the-counter form of Colace or another stool softener and take twice a day as long as you are requiring postoperative pain medications.  Take with a full glass of water daily.  If you experience loose stools or diarrhea, hold the colace until you stool forms back up. If your symptoms do not get better within 1 week or if they get worse, check with your doctor. Dulcolax (bisacodyl) - Pick up over-the-counter and take as directed by the product packaging as needed to assist with the movement of your bowels.  Take with a full glass of water.  Use this product as needed if not relieved by Colace only.  MiraLax (polyethylene glycol) - Pick up over-the-counter to have on hand. MiraLax is a solution that will increase the amount of water in your bowels to assist with bowel movements.  Take as directed and can mix with a glass of water, juice, soda, coffee, or tea. Take if you go more than two days without a movement. Do not use MiraLax more than once per day. Call your doctor if you are still constipated or irregular after using this medication for 7 days in a row.  If you continue  to have problems with postoperative constipation, please contact the office for further assistance and recommendations.  If you experience "the worst abdominal pain ever" or develop nausea or vomiting, please contact the office immediatly for further recommendations for treatment.  ITCHING If you experience itching with your medications, try taking only a single pain pill, or even half a pain pill at a time.  You can also use Benadryl over the counter for itching or also to help with sleep.   MEDICATIONS See your medication summary on the "After Visit Summary" that the nursing staff will review with you prior to discharge.  You may have some home medications which will be placed on hold until you complete the course of blood thinner medication.  It is important for you to complete the blood thinner medication as prescribed by your surgeon.  Continue your approved medications as instructed at time of discharge.  PRECAUTIONS If you experience chest pain or shortness of breath - call 911 immediately for transfer to the hospital emergency department.  If you develop a fever greater  that 101 F, purulent drainage from wound, increased redness or drainage from wound, foul odor from the wound/dressing, or calf pain - CONTACT YOUR SURGEON.                                                   FOLLOW-UP APPOINTMENTS Make sure you keep all of your appointments after your operation with your surgeon and caregivers. You should call the office at the above phone number and make an appointment for approximately two weeks after the date of your surgery or on the date instructed by your surgeon outlined in the "After Visit Summary".  RANGE OF MOTION AND STRENGTHENING EXERCISES  Rehabilitation of the knee is important following a knee injury or an operation. After just a few days of immobilization, the muscles of the thigh which control the knee become weakened and shrink (atrophy). Knee exercises are designed to build up  the tone and strength of the thigh muscles and to improve knee motion. Often times heat used for twenty to thirty minutes before working out will loosen up your tissues and help with improving the range of motion but do not use heat for the first two weeks following surgery. These exercises can be done on a training (exercise) mat, on the floor, on a table or on a bed. Use what ever works the best and is most comfortable for you Knee exercises include:  Leg Lifts - While your knee is still immobilized in a splint or cast, you can do straight leg raises. Lift the leg to 60 degrees, hold for 3 sec, and slowly lower the leg. Repeat 10-20 times 2-3 times daily. Perform this exercise against resistance later as your knee gets better.  Quad and Hamstring Sets - Tighten up the muscle on the front of the thigh (Quad) and hold for 5-10 sec. Repeat this 10-20 times hourly. Hamstring sets are done by pushing the foot backward against an object and holding for 5-10 sec. Repeat as with quad sets.  Leg Slides: Lying on your back, slowly slide your foot toward your buttocks, bending your knee up off the floor (only go as far as is comfortable). Then slowly slide your foot back down until your leg is flat on the floor again. Angel Wings: Lying on your back spread your legs to the side as far apart as you can without causing discomfort.  A rehabilitation program following serious knee injuries can speed recovery and prevent re-injury in the future due to weakened muscles. Contact your doctor or a physical therapist for more information on knee rehabilitation.   POST-OPERATIVE OPIOID TAPER INSTRUCTIONS: It is important to wean off of your opioid medication as soon as possible. If you do not need pain medication after your surgery it is ok to stop day one. Opioids include: Codeine, Hydrocodone(Norco, Vicodin), Oxycodone(Percocet, oxycontin) and hydromorphone amongst others.  Long term and even short term use of opiods can  cause: Increased pain response Dependence Constipation Depression Respiratory depression And more.  Withdrawal symptoms can include Flu like symptoms Nausea, vomiting And more Techniques to manage these symptoms Hydrate well Eat regular healthy meals Stay active Use relaxation techniques(deep breathing, meditating, yoga) Do Not substitute Alcohol to help with tapering If you have been on opioids for less than two weeks and do not have pain than it is ok to stop all  together.  Plan to wean off of opioids This plan should start within one week post op of your joint replacement. Maintain the same interval or time between taking each dose and first decrease the dose.  Cut the total daily intake of opioids by one tablet each day Next start to increase the time between doses. The last dose that should be eliminated is the evening dose.   IF YOU ARE TRANSFERRED TO A SKILLED REHAB FACILITY If the patient is transferred to a skilled rehab facility following release from the hospital, a list of the current medications will be sent to the facility for the patient to continue.  When discharged from the skilled rehab facility, please have the facility set up the patient's [place] prior to being released. Also, the skilled facility will be responsible for providing the patient with their medications at time of release from the facility to include their pain medication, the muscle relaxants, and their blood thinner medication. If the patient is still at the rehab facility at time of the two week follow up appointment, the skilled rehab facility will also need to assist the patient in arranging follow up appointment in our office and any transportation needs.  MAKE SURE YOU:  Understand these instructions.  Get help right away if you are not doing well or get worse.   DENTAL ANTIBIOTICS:  In most cases prophylactic antibiotics for Dental procdeures after total joint surgery are  not necessary.  Exceptions are as follows:  1. History of prior total joint infection  2. Severely immunocompromised (Organ Transplant, cancer chemotherapy, Rheumatoid biologic meds such as Ortley)  3. Poorly controlled diabetes (A1C &gt; 8.0, blood glucose over 200)  If you have one of these conditions, contact your surgeon for an antibiotic prescription, prior to your dental procedure.    Pick up stool softner and laxative for home use following surgery while on pain medications. Do not submerge incision under water. Please use good hand washing techniques while changing dressing each day. May shower starting three days after surgery. Please use a clean towel to pat the incision dry following showers. Continue to use ice for pain and swelling after surgery. Do not use any lotions or creams on the incision until instructed by your surgeon.

## 2022-03-24 NOTE — Brief Op Note (Signed)
03/24/2022  10:19 AM  PATIENT:  Ashley [name]  57 y.o. female  PRE-OPERATIVE DIAGNOSIS:  failed right total knee arthroplasty  POST-OPERATIVE DIAGNOSIS:  failed right total knee arthroplasty  PROCEDURE:  Procedure(s): TOTAL KNEE REVISION (Right)  SURGEON:  Surgeon(s) and Role:    Gaynelle Arabian, MD - Primary  PHYSICIAN ASSISTANT:   ASSISTANTS: Shearon Balo, PA-C   ANESTHESIA:   regional and general  EBL:  100 mL   BLOOD ADMINISTERED:none  DRAINS: none   LOCAL MEDICATIONS USED:  OTHER Exparel  COUNTS:  YES  TOURNIQUET:   Total Tourniquet Time Documented: Thigh (Right) - 80 minutes Total: Thigh (Right) - 80 minutes   DICTATION: .Other Dictation: Dictation Number [id number]  PLAN OF CARE: Admit to inpatient   PATIENT DISPOSITION:  PACU - hemodynamically stable.

## 2022-03-25 ENCOUNTER — Other Ambulatory Visit (HOSPITAL_COMMUNITY): Payer: Self-pay

## 2022-03-25 DIAGNOSIS — T84092A Other mechanical complication of internal right knee prosthesis, initial encounter: Secondary | ICD-10-CM | POA: Diagnosis not present

## 2022-03-25 LAB — CBC
HCT: 30.5 % — ABNORMAL LOW (ref 36.0–46.0)
Hemoglobin: 10.3 g/dL — ABNORMAL LOW (ref 12.0–15.0)
MCH: 31.3 pg (ref 26.0–34.0)
MCHC: 33.8 g/dL (ref 30.0–36.0)
MCV: 92.7 fL (ref 80.0–100.0)
Platelets: 194 10*3/uL (ref 150–400)
RBC: 3.29 MIL/uL — ABNORMAL LOW (ref 3.87–5.11)
RDW: 13 % (ref 11.5–15.5)
WBC: 7.9 10*3/uL (ref 4.0–10.5)
nRBC: 0 % (ref 0.0–0.2)

## 2022-03-25 LAB — BASIC METABOLIC PANEL
Anion gap: 5 (ref 5–15)
BUN: 13 mg/dL (ref 6–20)
CO2: 23 mmol/L (ref 22–32)
Calcium: 8.5 mg/dL — ABNORMAL LOW (ref 8.9–10.3)
Chloride: 110 mmol/L (ref 98–111)
Creatinine, Ser: 0.75 mg/dL (ref 0.44–1.00)
GFR, Estimated: >60 mL/min (ref >60)
Glucose, Bld: 125 mg/dL — ABNORMAL HIGH (ref 70–99)
Potassium: 3.9 mmol/L (ref 3.5–5.1)
Sodium: 138 mmol/L (ref 135–145)

## 2022-03-25 MED ORDER — ASPIRIN 81 MG PO CHEW: CHEWABLE_TABLET | ORAL | 0 refills | Status: DC

## 2022-03-25 MED ORDER — ASPIRIN 81 MG PO CHEW
CHEWABLE_TABLET | ORAL | 0 refills | Status: DC
  Filled 2022-03-25: qty 35, 17d supply, fill #0

## 2022-03-25 MED ORDER — GABAPENTIN 300 MG PO CAPS
ORAL_CAPSULE | ORAL | 0 refills | Status: AC
  Filled 2022-03-25 (×2): qty 84, 42d supply, fill #0

## 2022-03-25 MED ORDER — BACLOFEN 10 MG PO TABS: 10.0000 mg | ORAL_TABLET | Freq: Four times a day (QID) | ORAL | 0 refills | Status: DC | PRN

## 2022-03-25 MED ORDER — HYDROMORPHONE HCL 4 MG PO TABS
2.0000 mg | ORAL_TABLET | Freq: Four times a day (QID) | ORAL | 0 refills | Status: DC | PRN
  Filled 2022-03-25: qty 20, 5d supply, fill #0

## 2022-03-25 MED ORDER — GABAPENTIN 300 MG PO CAPS: ORAL_CAPSULE | ORAL | 0 refills | Status: DC

## 2022-03-25 MED ORDER — TRAMADOL HCL 50 MG PO TABS: 50.0000 mg | ORAL_TABLET | Freq: Four times a day (QID) | ORAL | 0 refills | Status: DC | PRN

## 2022-03-25 MED ORDER — HYDROMORPHONE HCL 2 MG PO TABS
2.0000 mg | ORAL_TABLET | ORAL | Status: DC | PRN
  Administered 2022-03-25: 2 mg via ORAL
  Administered 2022-03-25: 4 mg via ORAL
  Filled 2022-03-25 (×2): qty 2

## 2022-03-25 MED ORDER — TRAMADOL HCL 50 MG PO TABS
50.0000 mg | ORAL_TABLET | Freq: Four times a day (QID) | ORAL | 0 refills | Status: DC | PRN
  Filled 2022-03-25: qty 42, 6d supply, fill #0

## 2022-03-25 MED ORDER — HYDROMORPHONE HCL 2 MG PO TABS: 2.0000 mg | ORAL_TABLET | Freq: Four times a day (QID) | ORAL | 0 refills | Status: DC | PRN

## 2022-03-25 MED ORDER — HYDROMORPHONE HCL 1 MG/ML IJ SOLN: 0.5000 mg | INTRAMUSCULAR | Status: DC | PRN

## 2022-03-25 NOTE — Progress Notes (Signed)
Physical Therapy Treatment Patient Details Name: Ashley [name] MRN: [id number] DOB: 30-Dec-1965 Today's Date: 03/25/2022   History of Present Illness 57 yo female presents to therapy s/p R TKA on 03/24/2022 due to failed R TKA (2017). Pt pmh includes but is not limited to: anemia, arthritis, and B TKA (2017).    PT Comments    POD # 1 am session General Comments: AxO x 3 pleasant/motivated/knowledgable Hx B TKR Assisted OOB to amb in hallway went well.  General bed mobility comments: demonstarted and educated on use of belt to assist LE.  General transfer comment: <25% VC's on proper tech and increased time but self able.  General Gait Details: tolerated an increased distance with < 25% VC's on proper walker use and sequencing.  Recliner following as a precaution.  No c/o dizziness/nausea.  Tolerated distance well. Then returned to room to perform some TE's following HEP handout.  Instructed on proper tech, freq as well as use of ICE.   Pt will need a second PT session to complete HEP TE's as well as practice stairs. Pt plans to D/C to home today if her pain is controlled.    Recommendations for follow up therapy are one component of a multi-disciplinary discharge planning process, led by the attending physician.  Recommendations may be updated based on patient status, additional functional criteria and insurance authorization.  Follow Up Recommendations  Outpatient PT     Assistance Recommended at Discharge Frequent or constant Supervision/Assistance  Patient can return home with the following A little help with walking and/or transfers;A little help with bathing/dressing/bathroom;Assistance with cooking/housework;Assist for transportation;Help with stairs or ramp for entrance   Equipment Recommendations  None recommended by PT    Recommendations for Other Services       Precautions / Restrictions Precautions Precautions: Knee;Fall Precaution Comments: instructed no pillow under  knee Restrictions Weight Bearing Restrictions: No RLE Weight Bearing: Weight bearing as tolerated     Mobility  Bed Mobility Overal bed mobility: Needs Assistance Bed Mobility: Supine to Sit     Supine to sit: Min guard     General bed mobility comments: demonstarted and educated on use of belt to assist LE    Transfers Overall transfer level: Needs assistance Equipment used: Rolling walker (2 wheels) Transfers: Sit to/from Stand Sit to Stand: Supervision, Min guard           General transfer comment: <25% VC's on proper tech and increased time but self able    Ambulation/Gait Ambulation/Gait assistance: Supervision, Min guard Gait Distance (Feet): 75 Feet Assistive device: Rolling walker (2 wheels) Gait Pattern/deviations: Step-to pattern, Decreased stance time - right Gait velocity: decreased     General Gait Details: tolerated an increased distance with < 25% VC's on proper walker use and sequencing.  Recliner following as a precaution.  No c/o dizziness/nausea.  Tolerated distance well.   Stairs             Wheelchair Mobility    Modified Rankin (Stroke Patients Only)       Balance                                            Cognition Arousal/Alertness: Awake/alert Behavior During Therapy: WFL for tasks assessed/performed Overall Cognitive Status: Within Functional Limits for tasks assessed  General Comments: AxO x 3 pleasant/motivated/knowledgable Hx B TKR        Exercises  Total Knee Replacement TE's following HEP handout 10 reps B LE ankle pumps 05 reps towel squeezes 05 reps knee presses 05 reps heel slides   Educated on use of gait belt to assist with TE's Followed by ICE     General Comments        Pertinent Vitals/Pain Pain Assessment Pain Assessment: Faces Faces Pain Scale: Hurts a little bit Pain Location: R knee Pain Descriptors / Indicators: Aching,  Tightness, Operative site guarding, Constant Pain Intervention(s): Monitored during session, Premedicated before session, Repositioned, Ice applied    Home Living                          Prior Function            PT Goals (current goals can now be found in the care plan section) Progress towards PT goals: Progressing toward goals    Frequency    7X/week      PT Plan Current plan remains appropriate    Co-evaluation              AM-PAC PT "6 Clicks" Mobility   Outcome Measure  Help needed turning from your back to your side while in a flat bed without using bedrails?: None Help needed moving from lying on your back to sitting on the side of a flat bed without using bedrails?: None Help needed moving to and from a bed to a chair (including a wheelchair)?: None Help needed standing up from a chair using your arms (e.g., wheelchair or bedside chair)?: A Little Help needed to walk in hospital room?: A Little Help needed climbing 3-5 steps with a railing? : A Little 6 Click Score: 21    End of Session Equipment Utilized During Treatment: Gait belt Activity Tolerance: Patient tolerated treatment well;No increased pain Patient left: in chair;with call bell/phone within reach;with chair alarm set Nurse Communication: Mobility status PT Visit Diagnosis: Unsteadiness on feet (R26.81);Other abnormalities of gait and mobility (R26.89);Muscle weakness (generalized) (M62.81);Pain;Difficulty in walking, not elsewhere classified (R26.2) Pain - Right/Left: Right Pain - part of body: Knee     Time: 0935-1000 PT Time Calculation (min) (ACUTE ONLY): 25 min  Charges:  $Gait Training: 8-22 mins $Therapeutic Exercise: 8-22 mins                     {Mitali Shenefield  PTA Acute  Rehabilitation Services Office M-F          [phone] Weekend pager [phone]

## 2022-03-25 NOTE — Progress Notes (Signed)
   Subjective: 1 Day Post-Op Procedure(s) (LRB): TOTAL KNEE REVISION (Right) Patient reports pain as mild.   Patient seen in rounds by Dr. Wynelle Link. Patient is well, and has had no acute complaints or problems No issues overnight. Denies chest pain, SOB, or calf pain. Foley catheter removed this AM.  We will continue therapy today, ambulated 54' yesterday.  Pt reports intolerance to oxycodone last night.  Objective: Vital signs in last 24 hours: Temp:  [97.3 F (36.3 C)-98.4 F (36.9 C)] 98.4 F (36.9 C) (03/07 0335) Pulse Rate:  [60-91] 71 (03/07 0335) Resp:  [16-21] 18 (03/07 0335) BP: (94-127)/(58-79) 120/74 (03/07 0335) SpO2:  [96 %-100 %] 99 % (03/07 0335) Weight:  [68 kg] 68 kg (03/06 1231)  Intake/Output from previous day:  Intake/Output Summary (Last 24 hours) at 03/25/2022 0738 Last data filed at 03/25/2022 0200 Gross per 24 hour  Intake 3077.5 ml  Output 2775 ml  Net 302.5 ml     Intake/Output this shift: No intake/output data recorded.  Labs: Recent Labs    03/25/22 0343  HGB 10.3*   Recent Labs    03/25/22 0343  WBC 7.9  RBC 3.29*  HCT 30.5*  PLT 194   Recent Labs    03/25/22 0343  NA 138  K 3.9  CL 110  CO2 23  BUN 13  CREATININE 0.75  GLUCOSE 125*  CALCIUM 8.5*   No results for input(s): "LABPT", "INR" in the last 72 hours.  Exam: General - Patient is Alert and Oriented Extremity - Neurovascular intact Dorsiflexion/Plantar flexion intact Calves soft and nontender Dressing - dressing C/D/I Motor Function - intact, moving foot and toes well on exam.   Past Medical History:  Diagnosis Date   Anemia    Arthritis    Diverticulosis    Dyspareunia in female    History of use of contraceptive intrauterine device (IUD)    Hypercholesteremia    Hypertension    past per pt   Spinal headache    after c-section   Wears glasses     Assessment/Plan: 1 Day Post-Op Procedure(s) (LRB): TOTAL KNEE REVISION (Right) Principal Problem:    Failed total knee arthroplasty ([place])  Estimated body mass index is 25.73 kg/m as calculated from the following:   Height as of this encounter: '5\' 4"'$  (1.626 m).   Weight as of this encounter: 68 kg. Up with therapy   Patient's anticipated LOS is less than 2 midnights, meeting these requirements: - Younger than 76 - Lives within 1 hour of care - Has a competent adult at home to recover with post-op recover - NO history of  - Chronic pain requiring opiods  - Diabetes  - Coronary Artery Disease  - Heart failure  - Heart attack  - Stroke  - DVT/VTE  - Cardiac arrhythmia  - Respiratory Failure/COPD  - Renal failure  - Anemia  - Advanced Liver disease     DVT Prophylaxis - Aspirin Weight bearing as tolerated. Continue therapy. Will d/c oxycodone and start prn hydromorphone for pain.  Plan is to go Home after hospital stay. Plan for discharge later today if progresses with therapy and meeting goals. Scheduled for OPPT at [facility]. Follow-up in the office in 2 weeks.  The PDMP database was reviewed today prior to any opioid medications being prescribed to this patient.   Shearon Balo, PA-C Orthopedic Surgery 03/25/2022, 7:38 AM

## 2022-03-25 NOTE — Plan of Care (Signed)
  Problem: Pain Management: Goal: Pain level will decrease with appropriate interventions Outcome: Progressing   Problem: Coping: Goal: Level of anxiety will decrease Outcome: Progressing   

## 2022-03-25 NOTE — TOC Transition Note (Signed)
Transition of Care [facility]) - CM/SW Discharge Note   Patient Details  Name: Ashley [name] MRN: [id number] Date of Birth: Aug 26, 1965  [facility] CM/SW Contact:  Lennart Pall, LCSW Phone Number: 03/25/2022, 11:25 AM   Clinical Narrative:     Met with pt and confirming she has needed DME at home.  OPPT already arranged with [facility].  No TOC needs.  Final next level of care: OP Rehab Barriers to Discharge: No Barriers Identified   Patient Goals and CMS Choice      Discharge Placement                         Discharge Plan and Services Additional resources added to the After Visit Summary for                  DME Arranged: N/A DME Agency: NA                  Social Determinants of Health ([place]) Interventions [place]: Low Risk  (03/24/2022)  Tobacco Use: Low Risk  (03/24/2022)     Readmission Risk Interventions     No data to display

## 2022-03-25 NOTE — Progress Notes (Signed)
Physical Therapy Treatment Patient Details Name: Ashley [name] MRN: [id number] DOB: 1965-10-21 Today's Date: 03/25/2022   History of Present Illness 57 yo female presents to therapy s/p R TKA on 03/24/2022 due to failed R TKA (2017). Pt pmh includes but is not limited to: anemia, arthritis, and B TKA (2017).    PT Comments    POD # 1 pm session Assisted with amb in hallway, practiced stairs and reviewed HEP. Addressed all mobility questions, discussed appropriate activity, educated on use of ICE.  Pt ready for D/C to home.   Recommendations for follow up therapy are one component of a multi-disciplinary discharge planning process, led by the attending physician.  Recommendations may be updated based on patient status, additional functional criteria and insurance authorization.  Follow Up Recommendations  Outpatient PT     Assistance Recommended at Discharge Frequent or constant Supervision/Assistance  Patient can return home with the following A little help with walking and/or transfers;A little help with bathing/dressing/bathroom;Assistance with cooking/housework;Assist for transportation;Help with stairs or ramp for entrance   Equipment Recommendations  None recommended by PT    Recommendations for Other Services       Precautions / Restrictions Precautions Precautions: Knee;Fall Precaution Comments: instructed no pillow under knee Restrictions Weight Bearing Restrictions: No RLE Weight Bearing: Weight bearing as tolerated     Mobility  Bed Mobility Overal bed mobility: Needs Assistance Bed Mobility: Supine to Sit     Supine to sit: Min guard     General bed mobility comments: OOB in recliner    Transfers Overall transfer level: Needs assistance Equipment used: Rolling walker (2 wheels) Transfers: Sit to/from Stand Sit to Stand: Supervision           General transfer comment: good safety cognition and use of hands to steady self     Ambulation/Gait Ambulation/Gait assistance: Supervision Gait Distance (Feet): 65 Feet Assistive device: Rolling walker (2 wheels) Gait Pattern/deviations: Step-to pattern, Decreased stance time - right Gait velocity: decreased     General Gait Details: amb well with walker.  Alternating gait.  Slightly shaking this afternoon with c/o's of "chills" and fatigue (poor sleep last night).  Pt agreeable to D/C today.   Stairs Stairs: Yes Stairs assistance: Min assist Stair Management: No rails, Step to pattern, Forwards, With walker Number of Stairs: 2 General stair comments: practiced 2 steps with NO rails using walker up forward at 25% VC's on proper walker placement, proper sequencing as well as safety with turns.  Performed twice.  Pt did well.   Wheelchair Mobility    Modified Rankin (Stroke Patients Only)       Balance                                            Cognition Arousal/Alertness: Awake/alert Behavior During Therapy: WFL for tasks assessed/performed Overall Cognitive Status: Within Functional Limits for tasks assessed                                 General Comments: AxO x 3 pleasant/motivated/knowledgable Hx B TKR        Exercises      General Comments        Pertinent Vitals/Pain Pain Assessment Pain Assessment: Faces Faces Pain Scale: Hurts a little bit Pain Location: R knee Pain Descriptors / Indicators:  Aching, Tightness, Operative site guarding, Constant Pain Intervention(s): Premedicated before session, Repositioned, Ice applied, Monitored during session    Home Living                          Prior Function            PT Goals (current goals can now be found in the care plan section) Progress towards PT goals: Progressing toward goals    Frequency    7X/week      PT Plan Current plan remains appropriate    Co-evaluation              AM-PAC PT "6 Clicks" Mobility    Outcome Measure  Help needed turning from your back to your side while in a flat bed without using bedrails?: None Help needed moving from lying on your back to sitting on the side of a flat bed without using bedrails?: None Help needed moving to and from a bed to a chair (including a wheelchair)?: None Help needed standing up from a chair using your arms (e.g., wheelchair or bedside chair)?: None Help needed to walk in hospital room?: A Little Help needed climbing 3-5 steps with a railing? : A Little 6 Click Score: 22    End of Session Equipment Utilized During Treatment: Gait belt Activity Tolerance: Patient tolerated treatment well;No increased pain Patient left: in chair;with call bell/phone within reach;with chair alarm set Nurse Communication: Mobility status PT Visit Diagnosis: Unsteadiness on feet (R26.81);Other abnormalities of gait and mobility (R26.89);Muscle weakness (generalized) (M62.81);Pain;Difficulty in walking, not elsewhere classified (R26.2) Pain - Right/Left: Right Pain - part of body: Knee     Time: 1415-1440 PT Time Calculation (min) (ACUTE ONLY): 25 min  Charges:  $Gait Training: 8-22 mins $Therapeutic Activity: 8-22 mins                     Rica Koyanagi  PTA [facility] M-F          [phone] Weekend pager [phone]

## 2022-03-27 ENCOUNTER — Other Ambulatory Visit (HOSPITAL_COMMUNITY): Payer: Self-pay

## 2022-03-27 NOTE — Discharge Summary (Signed)
Physician Discharge Summary   Patient ID: Ashley [name] MRN: [id number] DOB/AGE: 09-24-1965 57 y.o.  Admit date: 03/24/2022 Discharge date: 03/25/2022  Primary Diagnosis: Failed right total knee arthroplasty.    Admission Diagnoses:  Past Medical History:  Diagnosis Date   Anemia    Arthritis    Diverticulosis    Dyspareunia in female    History of use of contraceptive intrauterine device (IUD)    Hypercholesteremia    Hypertension    past per pt   Spinal headache    after c-section   Wears glasses    Discharge Diagnoses:   Principal Problem:   Failed total knee arthroplasty (Ashley [name])  Estimated body mass index is 25.73 kg/m as calculated from the following:   Height as of this encounter: '5\' 4"'$  (1.626 m).   Weight as of this encounter: 68 kg.  Procedure:  Procedure(s) (LRB): TOTAL KNEE REVISION (Right)   Consults: None  HPI: The patient is a 57 year old female who had a bilateral total knee arthroplasties done less than 10 years ago and has had progressively worsening pain and dysfunction in the right worse than left knee.  She has a combination of instability as well as scar tissue with limited motion, but instability within that limited range.  Workup for infection was negative.  She had a bone scan, which suggested a possible loosening of the femoral and tibial components.  She presents now for total knee arthroplasty revision.  Laboratory Data: Admission on 03/24/2022, Discharged on 03/25/2022  Component Date Value Ref Range Status   WBC 03/25/2022 7.9  4.0 - 10.5 K/uL Final   RBC 03/25/2022 3.29 (L)  3.87 - 5.11 MIL/uL Final   Hemoglobin 03/25/2022 10.3 (L)  12.0 - 15.0 g/dL Final   HCT 03/25/2022 30.5 (L)  36.0 - 46.0 % Final   MCV 03/25/2022 92.7  80.0 - 100.0 fL Final   MCH 03/25/2022 31.3  26.0 - 34.0 pg Final   MCHC 03/25/2022 33.8  30.0 - 36.0 g/dL Final   RDW 03/25/2022 13.0  11.5 - 15.5 % Final   Platelets 03/25/2022 194  150 - 400 K/uL Final   nRBC  03/25/2022 0.0  0.0 - 0.2 % Final   Performed at [facility], [place] [address], [place], [place] [address]   Sodium 03/25/2022 138  135 - 145 mmol/L Final   Potassium 03/25/2022 3.9  3.5 - 5.1 mmol/L Final   Chloride 03/25/2022 110  98 - 111 mmol/L Final   CO2 03/25/2022 23  22 - 32 mmol/L Final   Glucose, Bld 03/25/2022 125 (H)  70 - 99 mg/dL Final   Glucose reference range applies only to samples taken after fasting for at least 8 hours.   BUN 03/25/2022 13  6 - 20 mg/dL Final   Creatinine, Ser 03/25/2022 0.75  0.44 - 1.00 mg/dL Final   Calcium 03/25/2022 8.5 (L)  8.9 - 10.3 mg/dL Final   GFR, Estimated 03/25/2022 >60  >60 mL/min Final   Comment: (NOTE) Calculated using the CKD-EPI Creatinine Equation (2021)    Anion gap 03/25/2022 5  5 - 15 Final   Performed at [facility], [place] [address], [place], [place] [address]  Hospital Outpatient Visit on 03/11/2022  Component Date Value Ref Range Status   MRSA, PCR 03/11/2022 NEGATIVE  NEGATIVE Final   Staphylococcus aureus 03/11/2022 NEGATIVE  NEGATIVE Final   Comment: (NOTE) The Xpert SA Assay (FDA approved for NASAL specimens in patients 22 years of  age and older), is one component of a comprehensive surveillance program. It is not intended to diagnose infection nor to guide or monitor treatment. Performed at [facility], [place] [address], [place], [place] [address]    WBC 03/11/2022 4.9  4.0 - 10.5 K/uL Final   RBC 03/11/2022 4.24  3.87 - 5.11 MIL/uL Final   Hemoglobin 03/11/2022 12.9  12.0 - 15.0 g/dL Final   HCT 03/11/2022 39.3  36.0 - 46.0 % Final   MCV 03/11/2022 92.7  80.0 - 100.0 fL Final   MCH 03/11/2022 30.4  26.0 - 34.0 pg Final   MCHC 03/11/2022 32.8  30.0 - 36.0 g/dL Final   RDW 03/11/2022 12.9  11.5 - 15.5 % Final   Platelets 03/11/2022 245  150 - 400 K/uL Final   nRBC 03/11/2022 0.0  0.0 - 0.2 % Final   Performed at [facility], [place]  [address] [place], [place] [address]   ABO/RH(D) 03/11/2022 O NEG   Final   Antibody Screen 03/11/2022 NEG   Final   Sample Expiration 03/11/2022 03/25/2022,2359   Final   Extend sample reason 03/11/2022    Final                   Value:NO TRANSFUSIONS OR PREGNANCY IN THE PAST 3 MONTHS Performed at [place] [address] [place], [place] [address]    Sodium 03/11/2022 141  135 - 145 mmol/L Final   Potassium 03/11/2022 3.9  3.5 - 5.1 mmol/L Final   Chloride 03/11/2022 110  98 - 111 mmol/L Final   CO2 03/11/2022 25  22 - 32 mmol/L Final   Glucose, Bld 03/11/2022 90  70 - 99 mg/dL Final   Glucose reference range applies only to samples taken after fasting for at least 8 hours.   BUN 03/11/2022 19  6 - 20 mg/dL Final   Creatinine, Ser 03/11/2022 0.83  0.44 - 1.00 mg/dL Final   Calcium 03/11/2022 9.1  8.9 - 10.3 mg/dL Final   GFR, Estimated 03/11/2022 >60  >60 mL/min Final   Comment: (NOTE) Calculated using the CKD-EPI Creatinine Equation (2021)    Anion gap 03/11/2022 6  5 - 15 Final   Performed at [facility], [place] [address] [place], [place] [address]     X-Rays:No results found.  EKG: Orders placed or performed during the hospital encounter of 03/11/22   EKG 12-LEAD   EKG 12-LEAD     Hospital Course: Ashley [name] is a 57 y.o. who was admitted to Ashley [name]. They were brought to the operating room on 03/24/2022 and underwent Procedure(s): Ashley [name].  Patient tolerated the procedure well and was later transferred to the recovery room and then to the orthopaedic floor for postoperative care. They were given PO and IV analgesics for pain control following their surgery. They were given 24 hours of postoperative antibiotics of  Anti-infectives (From admission, onward)    Start     Dose/Rate Route Frequency Ordered Stop   03/24/22 1430  ceFAZolin (ANCEF) IVPB 2g/100 mL premix        2 g 200 mL/hr over 30 Minutes  Intravenous Every 6 hours 03/24/22 1241 03/24/22 2111   03/24/22 0645  ceFAZolin (ANCEF) IVPB 2g/100 mL premix        2 g 200 mL/hr over 30 Minutes Intravenous On call to O.R. 03/24/22 [id number] 03/24/22 [id number]      and started on DVT prophylaxis  in the form of Aspirin.   PT and OT were ordered for total joint protocol. Discharge planning consulted to help with postop disposition and equipment needs.  Patient had a good night on the evening of surgery. They started to get up OOB with therapy on POD #0. Pt was seen during rounds and was ready to go home pending progress with therapy. She worked with therapy on POD #1 and was meeting her goals. Pt was discharged to home later that day in stable condition.  Diet: Regular diet Activity: WBAT Follow-up: in 2 weeks Disposition: Home Discharged Condition: stable   Discharge Instructions     Call MD / Call 911   Complete by: As directed    If you experience chest pain or shortness of breath, CALL 911 and be transported to the hospital emergency room.  If you develope a fever above 101 F, pus (white drainage) or increased drainage or redness at the wound, or calf pain, call your surgeon's office.   Change dressing   Complete by: As directed    You may remove the bulky bandage (ACE wrap and gauze) two days after surgery. You will have an adhesive waterproof bandage underneath. Leave this in place until your first follow-up appointment.   Constipation Prevention   Complete by: As directed    Drink plenty of fluids.  Prune juice may be helpful.  You may use a stool softener, such as Colace (over the counter) 100 mg twice a day.  Use MiraLax (over the counter) for constipation as needed.   Diet - low sodium heart healthy   Complete by: As directed    Do not put a pillow under the knee. Place it under the heel.   Complete by: As directed    Driving restrictions   Complete by: As directed    No driving for two weeks   Post-operative opioid taper  instructions:   Complete by: As directed    POST-OPERATIVE OPIOID TAPER INSTRUCTIONS: It is important to wean off of your opioid medication as soon as possible. If you do not need pain medication after your surgery it is ok to stop day one. Opioids include: Codeine, Hydrocodone(Norco, Vicodin), Oxycodone(Percocet, oxycontin) and hydromorphone amongst others.  Long term and even short term use of opiods can cause: Increased pain response Dependence Constipation Depression Respiratory depression And more.  Withdrawal symptoms can include Flu like symptoms Nausea, vomiting And more Techniques to manage these symptoms Hydrate well Eat regular healthy meals Stay active Use relaxation techniques(deep breathing, meditating, yoga) Do Not substitute Alcohol to help with tapering If you have been on opioids for less than two weeks and do not have pain than it is ok to stop all together.  Plan to wean off of opioids This plan should start within one week post op of your joint replacement. Maintain the same interval or time between taking each dose and first decrease the dose.  Cut the total daily intake of opioids by one tablet each day Next start to increase the time between doses. The last dose that should be eliminated is the evening dose.      TED hose   Complete by: As directed    Use stockings (TED hose) for three weeks on both leg(s).  You may remove them at night for sleeping.   Weight bearing as tolerated   Complete by: As directed       Allergies as of 03/25/2022   No Known Allergies  Medication List     STOP taking these medications    methocarbamol 500 MG tablet Commonly known as: ROBAXIN       TAKE these medications    acetaminophen 500 MG tablet Commonly known as: TYLENOL Take 1,000 mg by mouth every 6 (six) hours as needed (for pain.).   Aspirin Low Dose 81 MG chewable tablet Generic drug: aspirin Take 1 tablet twice daily for 3 weeks, then take 1  tablet daily for 3 weeks.   baclofen 10 MG tablet Commonly known as: LIORESAL Take 1 tablet (10 mg total) by mouth every 6 (six) hours as needed for muscle spasms. What changed: when to take this   gabapentin 300 MG capsule Commonly known as: NEURONTIN Take 1 capsule 3 times a day for 2 weeks following surgery.Then take 1 capsule 2 times a day for 2 weeks. Then take 1 capsule daily for 2 weeks. Then discontinue.   HYDROmorphone 4 MG tablet Commonly known as: DILAUDID Take 1/2 - 1 tablet (2 - 4 mg total) by mouth every 6 hours as needed for severe pain.   multivitamin with minerals Tabs tablet Take 1 tablet by mouth daily.   rosuvastatin 5 MG tablet Commonly known as: CRESTOR Take 5 mg by mouth daily.   topiramate 50 MG tablet Commonly known as: TOPAMAX Take 50 mg by mouth 2 (two) times daily.   traMADol 50 MG tablet Commonly known as: ULTRAM Take 1 - 2 tablets (50 - 100 mg total) by mouth every 6 hours as needed for moderate pain. What changed:  when to take this reasons to take this   Vitamin D3 125 MCG (5000 UT) Tabs Generic drug: Cholecalciferol Take 5,000 Units by mouth daily.               Discharge Care Instructions  (From admission, onward)           Start     Ordered   03/25/22 0000  Weight bearing as tolerated        03/25/22 1340   03/25/22 0000  Change dressing       Comments: You may remove the bulky bandage (ACE wrap and gauze) two days after surgery. You will have an adhesive waterproof bandage underneath. Leave this in place until your first follow-up appointment.   03/25/22 1340            Follow-up Information     Gaynelle Arabian, MD Follow up in 2 week(s).   Specialty: Orthopedic Surgery Contact information: [address] [place] [address] [phone]                 Signed: R. Jaynie Bream, PA-C Orthopedic Surgery 03/27/2022, 11:54 AM

## 2022-03-31 ENCOUNTER — Encounter (HOSPITAL_COMMUNITY): Payer: Self-pay | Admitting: Orthopedic Surgery

## 2022-04-12 ENCOUNTER — Ambulatory Visit (INDEPENDENT_AMBULATORY_CARE_PROVIDER_SITE_OTHER): Payer: Managed Care, Other (non HMO)

## 2022-04-12 ENCOUNTER — Ambulatory Visit: Payer: Managed Care, Other (non HMO) | Admitting: Podiatry

## 2022-04-12 DIAGNOSIS — M7750 Other enthesopathy of unspecified foot: Secondary | ICD-10-CM

## 2022-04-12 DIAGNOSIS — M7752 Other enthesopathy of left foot: Secondary | ICD-10-CM | POA: Diagnosis not present

## 2022-04-12 DIAGNOSIS — M722 Plantar fascial fibromatosis: Secondary | ICD-10-CM | POA: Diagnosis not present

## 2022-04-12 MED ORDER — MELOXICAM 15 MG PO TABS: 15.0000 mg | ORAL_TABLET | Freq: Every day | ORAL | 0 refills | Status: DC

## 2022-04-12 NOTE — Patient Instructions (Signed)

## 2022-04-12 NOTE — Progress Notes (Signed)
  Subjective:  Patient ID: Ashley [name], female    DOB: Mar 07, 1965,  MRN: [id number]  Chief Complaint  Patient presents with   Foot Pain    (np) left foot pain    57 y.o. female presents with the above complaint.  Patient presents with pain in her left heel and arch.  She says it started after she had revision knee surgery on her right knee approximately 20 days ago.  She has pain in the left heel and arch that gets worse after she has been walking she was back to a cane after surgery but now back to her walker due to her increasing pain.  She is taking tramadol muscle relaxant and gabapentin.   Review of Systems: Negative except as noted in the HPI. Denies N/V/F/Ch.   Objective:  There were no vitals filed for this visit. There is no height or weight on file to calculate BMI. Constitutional Well developed. Well nourished.  Vascular Dorsalis pedis pulses palpable bilaterally. Posterior tibial pulses palpable bilaterally. Capillary refill normal to all digits.  No cyanosis or clubbing noted. Pedal hair growth normal.  Neurologic Normal speech. Oriented to person, place, and time. Epicritic sensation to light touch grossly present bilaterally.  Dermatologic Nails well groomed and normal in appearance. No open wounds. No skin lesions.  Orthopedic: Normal joint ROM without pain or crepitus bilaterally. No visible deformities. Tender to palpation at the calcaneal tuber left. No pain with calcaneal squeeze left. Ankle ROM diminished range of motion left. Silfverskiold Test: negative left.   Radiographs: Taken and reviewed. No acute fractures or dislocations. No evidence of stress fracture.  Plantar heel spur absent. Posterior heel spur absent.   Assessment:   1. Plantar fasciitis, left   2. Tendonitis of ankle or foot    Plan:  Patient was evaluated and treated and all questions answered.  Plantar Fasciitis, left - XR reviewed as above.  - Educated on icing and stretching.  Instructions given.  - Injection delivered to the plantar fascia as below. - DME: [employer] orthotics dispensed and discussed use of these - Pharmacologic management: Meloxicam 15 mg take 1 daily for the next 30 days. Educated on risks/benefits and proper taking of medication.  Procedure: Injection Tendon/Ligament Location: Left plantar fascia at the glabrous junction; medial approach. Skin Prep: alcohol Injectate: 1 cc 0.5% marcaine plain, 1 cc kenalog 10. Disposition: Patient tolerated procedure well. Injection site dressed with a band-aid.  Return in about 4 weeks (around 05/10/2022) for f/u L PF.

## 2022-04-15 ENCOUNTER — Ambulatory Visit: Payer: Managed Care, Other (non HMO) | Admitting: Podiatry

## 2022-05-09 ENCOUNTER — Other Ambulatory Visit: Payer: Self-pay | Admitting: Podiatry

## 2022-05-10 ENCOUNTER — Ambulatory Visit: Payer: Managed Care, Other (non HMO) | Admitting: Podiatry

## 2022-06-23 NOTE — Progress Notes (Signed)
Sent message, via epic in basket, requesting orders in epic from surgeon.  

## 2022-06-27 NOTE — Progress Notes (Signed)
COVID Vaccine received:  []  No [x]  Yes Date of any COVID positive Test in last 90 days:  PCP - Buckner Malta, MD Cardiologist - n/a Pain Medicine- Arlyn Leak, PA- at [facility]  Chest x-ray - 09-19-2015  2v  Epic EKG -  03-11-2022  Epic Stress Test -  ECHO -  Cardiac Cath -   PCR screen: []  Ordered & Completed           []   No Order but Needs PROFEND           [x]   N/A for this surgery  Surgery Plan:  [x]  Ambulatory                            []  Outpatient in bed                            []  Admit  Anesthesia:    []  General  []  Spinal                           [x]   Choice []   MAC  Pacemaker / ICD device [x]  No []  Yes   Spinal Cord Stimulator:[x]  No []  Yes       History of Sleep Apnea? [x]  No []  Yes   CPAP used?- [x]  No []  Yes    Does the patient monitor blood sugar?          []  No []  Yes  [x]  N/A  Patient has: [x]  NO Hx DM   []  Pre-DM                 []  DM1  []   DM2  Blood Thinner / Instructions:None Aspirin Instructions:  ASA 81 mg  ERAS Protocol Ordered: []  No  [x]  Yes PRE-SURGERY [x]  ENSURE  []  G2  Patient is to be NPO after: 12:00 noon  Comments:   Activity level: Patient is unable to climb a flight of stairs without difficulty; [x]  No CP  [x]  No SOB, but would have severe knee pain  Patient can perform ADLs without assistance.   Anesthesia review: anemia, HTN, "spinal HA after c-section"  Patient denies shortness of breath, fever, cough and chest pain at PAT appointment.  Patient verbalized understanding and agreement to the Pre-Surgical Instructions that were given to them at this PAT appointment. Patient was also educated of the need to review these PAT instructions again prior to her surgery.I reviewed the appropriate phone numbers to call if they have any and questions or concerns.

## 2022-06-27 NOTE — Patient Instructions (Signed)
SURGICAL WAITING ROOM VISITATION Patients having surgery or a procedure may have no more than 2 support people in the waiting area - these visitors may rotate in the visitor waiting room.   Due to an increase in RSV and influenza rates and associated hospitalizations, children ages 38 and under may not visit patients in [facility] hospitals. If the patient needs to stay at the hospital during part of their recovery, the visitor guidelines for inpatient rooms apply.  PRE-OP VISITATION  Pre-op nurse will coordinate an appropriate time for 1 support person to accompany the patient in pre-op.  This support person may not rotate.  This visitor will be contacted when the time is appropriate for the visitor to come back in the pre-op area.  Please refer to the [facility] website for the visitor guidelines for Inpatients (after your surgery is over and you are in a regular room).  You are not required to quarantine at this time prior to your surgery. However, you must do this: Hand Hygiene often Do NOT share personal items Notify your provider if you are in close contact with someone who has COVID or you develop fever 100.4 or greater, new onset of sneezing, cough, sore throat, shortness of breath or body aches.  If you test positive for Covid or have been in contact with anyone that has tested positive in the last 10 days please notify you surgeon.    Your procedure is scheduled on: Monday  July 05, 2022   Report to [facility] Main Entrance: [place] entrance where the [employer] is available.   Report to admitting at:  12:30 PM  +++++Call this number if you have any questions or problems the morning of surgery [phone]  Do not eat food after Midnight the night prior to your surgery/procedure.  After Midnight you may have the following liquids until  12:00 Noon the DAY OF SURGERY  Clear Liquid Diet Water Black Coffee (sugar ok, NO MILK/CREAM OR CREAMERS)  Tea (sugar ok, NO  MILK/CREAM OR CREAMERS) regular and decaf                             Plain Jell-O  with no fruit (NO RED)                                           Fruit ices (not with fruit pulp, NO RED)                                     Popsicles (NO RED)                                                                  Juice: apple, WHITE grape, WHITE cranberry Sports drinks like Gatorade or Powerade (NO RED)                   The day of surgery:  Drink ONE (1) Pre-Surgery Clear Ensure at   12:00 noon the day of surgery. Drink in one sitting. Do  not sip.  This drink was given to you during your hospital pre-op appointment visit. Nothing else to drink after completing the Pre-Surgery Clear Ensure : No candy, chewing gum or throat lozenges.    FOLLOW ANY ADDITIONAL PRE OP INSTRUCTIONS YOU RECEIVED FROM YOUR SURGEON'S OFFICE!!!   Oral Hygiene is also important to reduce your risk of infection.        Remember - BRUSH YOUR TEETH THE MORNING OF SURGERY WITH YOUR REGULAR TOOTHPASTE  Do NOT smoke after Midnight the night before surgery.  Take ONLY these medicines the morning of surgery with A SIP OF WATER: Topiramate ??                   You may not have any metal on your body including hair pins, jewelry, and body piercing  Do not wear make-up, lotions, powders, perfumes or deodorant  Do not wear nail polish including gel and S&S, artificial / acrylic nails, or any other type of covering on natural nails including finger and toenails. If you have artificial nails, gel coating, etc., that needs to be removed by a nail salon, Please have this removed prior to surgery. Not doing so may mean that your surgery could be cancelled or delayed if the Surgeon or anesthesia staff feels like they are unable to monitor you safely.   Do not shave 48 hours prior to surgery to avoid nicks in your skin which may contribute to postoperative infections.   Contacts, Hearing Aids, dentures or bridgework may not be worn  into surgery. DENTURES WILL BE REMOVED PRIOR TO SURGERY PLEASE DO NOT APPLY "Poly grip" OR ADHESIVES!!!  Patients discharged on the day of surgery will not be allowed to drive home.  Someone NEEDS to stay with you for the first 24 hours after anesthesia.  Do not bring your home medications to the hospital. The Pharmacy will dispense medications listed on your medication list to you during your admission in the Hospital.   Please read over the following fact sheets you were given: IF YOU HAVE QUESTIONS ABOUT YOUR PRE-OP INSTRUCTIONS, PLEASE CALL [phone].   [facility] - Preparing for Surgery Before surgery, you can play an important role.  Because skin is not sterile, your skin needs to be as free of germs as possible.  You can reduce the number of germs on your skin by washing with CHG (chlorahexidine gluconate) soap before surgery.  CHG is an antiseptic cleaner which kills germs and bonds with the skin to continue killing germs even after washing. Please DO NOT use if you have an allergy to CHG or antibacterial soaps.  If your skin becomes reddened/irritated stop using the CHG and inform your nurse when you arrive at Short Stay. Do not shave (including legs and underarms) for at least 48 hours prior to the first CHG shower.  You may shave your face/neck.  Please follow these instructions carefully:  1.  Shower with CHG Soap the night before surgery and the  morning of surgery.  2.  If you choose to wash your hair, wash your hair first as usual with your normal  shampoo.  3.  After you shampoo, rinse your hair and body thoroughly to remove the shampoo.                             4.  Use CHG as you would any other liquid soap.  You can apply chg directly to  the skin and wash.  Gently with a scrungie or clean washcloth.  5.  Apply the CHG Soap to your body ONLY FROM THE NECK DOWN.   Do not use on face/ open                           Wound or open sores. Avoid contact with eyes, ears mouth  and genitals (private parts).                       Wash face,  Genitals (private parts) with your normal soap.             6.  Wash thoroughly, paying special attention to the area where your  surgery  will be performed.  7.  Thoroughly rinse your body with warm water from the neck down.  8.  DO NOT shower/wash with your normal soap after using and rinsing off the CHG Soap.            9.  Pat yourself dry with a clean towel.            10.  Wear clean pajamas.            11.  Place clean sheets on your bed the night of your first shower and do not  sleep with pets.  ON THE DAY OF SURGERY : Do not apply any lotions/deodorants the morning of surgery.  Please wear clean clothes to the hospital/surgery center.    FAILURE TO FOLLOW THESE INSTRUCTIONS MAY RESULT IN THE CANCELLATION OF YOUR SURGERY  PATIENT SIGNATURE_________________________________  NURSE SIGNATURE__________________________________  ________________________________________________________________________  .      Incentive Spirometer    An incentive spirometer is a tool that can help keep your lungs clear and active. This tool measures how well you are filling your lungs with each breath. Taking long deep breaths may help reverse or decrease the chance of developing breathing (pulmonary) problems (especially infection) following: A long period of time when you are unable to move or be active. BEFORE THE PROCEDURE  If the spirometer includes an indicator to show your best effort, your nurse or respiratory therapist will set it to a desired goal. If possible, sit up straight or lean slightly forward. Try not to slouch. Hold the incentive spirometer in an upright position. INSTRUCTIONS FOR USE  Sit on the edge of your bed if possible, or sit up as far as you can in bed or on a chair. Hold the incentive spirometer in an upright position. Breathe out normally. Place the mouthpiece in your mouth and seal your lips  tightly around it. Breathe in slowly and as deeply as possible, raising the piston or the ball toward the top of the column. Hold your breath for 3-5 seconds or for as long as possible. Allow the piston or ball to fall to the bottom of the column. Remove the mouthpiece from your mouth and breathe out normally. Rest for a few seconds and repeat Steps 1 through 7 at least 10 times every 1-2 hours when you are awake. Take your time and take a few normal breaths between deep breaths. The spirometer may include an indicator to show your best effort. Use the indicator as a goal to work toward during each repetition. After each set of 10 deep breaths, practice coughing to be sure your lungs are clear. If you have an incision (the cut made at the time of  surgery), support your incision when coughing by placing a pillow or rolled up towels firmly against it. Once you are able to get out of bed, walk around indoors and cough well. You may stop using the incentive spirometer when instructed by your caregiver.  RISKS AND COMPLICATIONS Take your time so you do not get dizzy or light-headed. If you are in pain, you may need to take or ask for pain medication before doing incentive spirometry. It is harder to take a deep breath if you are having pain. AFTER USE Rest and breathe slowly and easily. It can be helpful to keep track of a log of your progress. Your caregiver can provide you with a simple table to help with this. If you are using the spirometer at home, follow these instructions: SEEK MEDICAL CARE IF:  You are having difficultly using the spirometer. You have trouble using the spirometer as often as instructed. Your pain medication is not giving enough relief while using the spirometer. You develop fever of 100.5 F (38.1 C) or higher.                                                                                                    SEEK IMMEDIATE MEDICAL CARE IF:  You cough up bloody sputum that  had not been present before. You develop fever of 102 F (38.9 C) or greater. You develop worsening pain at or near the incision site. MAKE SURE YOU:  Understand these instructions. Will watch your condition. Will get help right away if you are not doing well or get worse. Document Released: 05/17/2006 Document Revised: 03/29/2011 Document Reviewed: 07/18/2006 [facility] Patient Information 2014 [place], [place].

## 2022-06-30 ENCOUNTER — Encounter (HOSPITAL_COMMUNITY)
Admission: RE | Admit: 2022-06-30 | Discharge: 2022-06-30 | Disposition: A | Discharge status: Home / Self Care | Payer: Managed Care, Other (non HMO) | Source: Ambulatory Visit | Attending: Orthopedic Surgery | Admitting: Orthopedic Surgery

## 2022-06-30 ENCOUNTER — Other Ambulatory Visit: Payer: Self-pay

## 2022-06-30 ENCOUNTER — Encounter (HOSPITAL_COMMUNITY): Payer: Self-pay

## 2022-06-30 VITALS — BP 128/90 | HR 72 | Temp 98.4°F | Resp 18 | Ht 64.0 in | Wt 148.0 lb

## 2022-06-30 DIAGNOSIS — I10 Essential (primary) hypertension: Secondary | ICD-10-CM | POA: Insufficient documentation

## 2022-06-30 DIAGNOSIS — Z01812 Encounter for preprocedural laboratory examination: Secondary | ICD-10-CM | POA: Diagnosis present

## 2022-06-30 LAB — CBC
HCT: 35.3 % — ABNORMAL LOW (ref 36.0–46.0)
Hemoglobin: 11.4 g/dL — ABNORMAL LOW (ref 12.0–15.0)
MCH: 29.5 pg (ref 26.0–34.0)
MCHC: 32.3 g/dL (ref 30.0–36.0)
MCV: 91.5 fL (ref 80.0–100.0)
Platelets: 235 10*3/uL (ref 150–400)
RBC: 3.86 MIL/uL — ABNORMAL LOW (ref 3.87–5.11)
RDW: 13.9 % (ref 11.5–15.5)
WBC: 5.5 10*3/uL (ref 4.0–10.5)
nRBC: 0 % (ref 0.0–0.2)

## 2022-06-30 LAB — BASIC METABOLIC PANEL
Anion gap: 7 (ref 5–15)
BUN: 27 mg/dL — ABNORMAL HIGH (ref 6–20)
CO2: 25 mmol/L (ref 22–32)
Calcium: 9 mg/dL (ref 8.9–10.3)
Chloride: 107 mmol/L (ref 98–111)
Creatinine, Ser: 0.88 mg/dL (ref 0.44–1.00)
GFR, Estimated: >60 mL/min (ref >60)
Glucose, Bld: 115 mg/dL — ABNORMAL HIGH (ref 70–99)
Potassium: 3.8 mmol/L (ref 3.5–5.1)
Sodium: 139 mmol/L (ref 135–145)

## 2022-07-05 ENCOUNTER — Other Ambulatory Visit: Payer: Self-pay

## 2022-07-05 ENCOUNTER — Ambulatory Visit (HOSPITAL_BASED_OUTPATIENT_CLINIC_OR_DEPARTMENT_OTHER): Payer: Managed Care, Other (non HMO) | Admitting: Anesthesiology

## 2022-07-05 ENCOUNTER — Encounter (HOSPITAL_COMMUNITY)
Admission: RE | Disposition: A | Discharge status: Home / Self Care | Payer: Self-pay | Source: Home / Self Care | Attending: Orthopedic Surgery

## 2022-07-05 ENCOUNTER — Encounter (HOSPITAL_COMMUNITY): Payer: Self-pay | Admitting: Orthopedic Surgery

## 2022-07-05 ENCOUNTER — Ambulatory Visit (HOSPITAL_COMMUNITY): Payer: Managed Care, Other (non HMO) | Admitting: Anesthesiology

## 2022-07-05 ENCOUNTER — Ambulatory Visit (HOSPITAL_COMMUNITY)
Admission: RE | Admit: 2022-07-05 | Discharge: 2022-07-05 | Disposition: A | Discharge status: Home / Self Care | Payer: Managed Care, Other (non HMO) | Attending: Orthopedic Surgery | Admitting: Orthopedic Surgery

## 2022-07-05 DIAGNOSIS — M1990 Unspecified osteoarthritis, unspecified site: Secondary | ICD-10-CM | POA: Insufficient documentation

## 2022-07-05 DIAGNOSIS — M24661 Ankylosis, right knee: Secondary | ICD-10-CM | POA: Diagnosis not present

## 2022-07-05 DIAGNOSIS — R519 Headache, unspecified: Secondary | ICD-10-CM | POA: Insufficient documentation

## 2022-07-05 DIAGNOSIS — Z96651 Presence of right artificial knee joint: Secondary | ICD-10-CM | POA: Insufficient documentation

## 2022-07-05 DIAGNOSIS — I10 Essential (primary) hypertension: Secondary | ICD-10-CM | POA: Insufficient documentation

## 2022-07-05 DIAGNOSIS — X58XXXA Exposure to other specified factors, initial encounter: Secondary | ICD-10-CM | POA: Insufficient documentation

## 2022-07-05 DIAGNOSIS — T8489XA Other specified complication of internal orthopedic prosthetic devices, implants and grafts, initial encounter: Secondary | ICD-10-CM | POA: Diagnosis present

## 2022-07-05 DIAGNOSIS — D649 Anemia, unspecified: Secondary | ICD-10-CM | POA: Diagnosis not present

## 2022-07-05 DIAGNOSIS — T8482XA Fibrosis due to internal orthopedic prosthetic devices, implants and grafts, initial encounter: Secondary | ICD-10-CM | POA: Diagnosis present

## 2022-07-05 HISTORY — PX: KNEE CLOSED REDUCTION: SHX995

## 2022-07-05 SURGERY — MANIPULATION, KNEE, CLOSED
Anesthesia: General | Site: Knee | Laterality: Right

## 2022-07-05 MED ORDER — MIDAZOLAM HCL 5 MG/5ML IJ SOLN
INTRAMUSCULAR | Status: DC | PRN
  Administered 2022-07-05: 2 mg via INTRAVENOUS

## 2022-07-05 MED ORDER — CHLORHEXIDINE GLUCONATE 0.12 % MT SOLN
15.0000 mL | Freq: Once | OROMUCOSAL | Status: AC
  Administered 2022-07-05: 15 mL via OROMUCOSAL

## 2022-07-05 MED ORDER — METHOCARBAMOL 500 MG PO TABS
500.0000 mg | ORAL_TABLET | Freq: Four times a day (QID) | ORAL | Status: DC | PRN
  Administered 2022-07-05: 500 mg via ORAL

## 2022-07-05 MED ORDER — LACTATED RINGERS IV SOLN: INTRAVENOUS | Status: DC

## 2022-07-05 MED ORDER — HYDROCODONE-ACETAMINOPHEN 5-325 MG PO TABS
1.0000 | ORAL_TABLET | Freq: Four times a day (QID) | ORAL | 0 refills | Status: AC | PRN
Start: 2022-07-05 — End: 2023-07-05

## 2022-07-05 MED ORDER — METHOCARBAMOL 500 MG PO TABS
ORAL_TABLET | ORAL | Status: AC
  Filled 2022-07-05: qty 1

## 2022-07-05 MED ORDER — TRAMADOL HCL 50 MG PO TABS
ORAL_TABLET | ORAL | Status: AC
  Filled 2022-07-05: qty 2

## 2022-07-05 MED ORDER — FENTANYL CITRATE (PF) 100 MCG/2ML IJ SOLN
INTRAMUSCULAR | Status: AC
  Filled 2022-07-05: qty 2

## 2022-07-05 MED ORDER — ACETAMINOPHEN 10 MG/ML IV SOLN: 1000.0000 mg | Freq: Four times a day (QID) | INTRAVENOUS | Status: DC

## 2022-07-05 MED ORDER — HYDROMORPHONE HCL 1 MG/ML IJ SOLN: 0.2500 mg | INTRAMUSCULAR | Status: DC | PRN

## 2022-07-05 MED ORDER — TRAMADOL HCL 50 MG PO TABS
50.0000 mg | ORAL_TABLET | Freq: Four times a day (QID) | ORAL | Status: DC | PRN
  Administered 2022-07-05: 100 mg via ORAL

## 2022-07-05 MED ORDER — PROPOFOL 500 MG/50ML IV EMUL
INTRAVENOUS | Status: DC | PRN
  Administered 2022-07-05: 60 mg via INTRAVENOUS

## 2022-07-05 MED ORDER — ORAL CARE MOUTH RINSE: 15.0000 mL | Freq: Once | OROMUCOSAL | Status: AC

## 2022-07-05 MED ORDER — POVIDONE-IODINE 10 % EX SWAB: 2.0000 | Freq: Once | CUTANEOUS | Status: DC

## 2022-07-05 MED ORDER — BUPIVACAINE LIPOSOME 1.3 % IJ SUSP: 20.0000 mL | Freq: Once | INTRAMUSCULAR | Status: DC

## 2022-07-05 MED ORDER — DEXAMETHASONE SODIUM PHOSPHATE 10 MG/ML IJ SOLN: 8.0000 mg | Freq: Once | INTRAMUSCULAR | Status: DC

## 2022-07-05 MED ORDER — MIDAZOLAM HCL 2 MG/2ML IJ SOLN
INTRAMUSCULAR | Status: AC
  Filled 2022-07-05: qty 2

## 2022-07-05 MED ORDER — FENTANYL CITRATE (PF) 100 MCG/2ML IJ SOLN
INTRAMUSCULAR | Status: DC | PRN
  Administered 2022-07-05: 100 ug via INTRAVENOUS

## 2022-07-05 MED ORDER — METHOCARBAMOL 500 MG IVPB - SIMPLE MED: 500.0000 mg | Freq: Four times a day (QID) | INTRAVENOUS | Status: DC | PRN

## 2022-07-05 MED ORDER — DEXMEDETOMIDINE HCL IN NACL 80 MCG/20ML IV SOLN
INTRAVENOUS | Status: DC | PRN
  Administered 2022-07-05: 8 ug via INTRAVENOUS

## 2022-07-05 MED ORDER — METHOCARBAMOL 500 MG PO TABS
500.0000 mg | ORAL_TABLET | Freq: Four times a day (QID) | ORAL | 0 refills | Status: AC | PRN
Start: 2022-07-05 — End: ?

## 2022-07-05 SURGICAL SUPPLY — 1 items: KIT TURNOVER KIT A (KITS) IMPLANT

## 2022-07-05 NOTE — Op Note (Signed)
  OPERATIVE REPORT   PREOPERATIVE DIAGNOSIS: Arthrofibrosis, Right  knee.   POSTOPERATIVE DIAGNOSIS: Arthrofibrosis, Right knee.   PROCEDURE:  Right  knee closed manipulation.   SURGEON: Ollen Gross, MD   ASSISTANT: None.   ANESTHESIA: General.   COMPLICATIONS: None.   CONDITION: Stable to Recovery.   Pre-manipulation range of motion is 0-100.  Post-manipulation range of  Motion is 0-130  PROCEDURE IN DETAIL: After successful administration of general  anesthetic, exam under anesthesia was performed showing range of motion  0-100 degrees. I then placed my chest against the proximal tibia,  flexing the knee with audible lysis of adhesions. I was easily able to  get the knee flexed to 130  degrees. I then put the knee back in extension and maintained full  Extension.The patient was subsequently awakened and transported to Recovery in  stable condition.

## 2022-07-05 NOTE — H&P (Signed)
CC- Ashley [name] is a 57 y.o. female who presents with right knee stiffness.  HPI- . Knee Pain: Patient presents with stiffness involving the  right knee. Onset of the symptoms was several months ago. 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 She has worked hard with physical therapy but has only been able to achieve 100 degrees with flexion. She presents now for closed manipulation  Past Medical History:  Diagnosis Date   Anemia    Arthritis    Diverticulosis    Dyspareunia in female    History of use of contraceptive intrauterine device (IUD)    Hypercholesteremia    Hypertension    past per pt   Spinal headache    after c-section   Wears glasses     Past Surgical History:  Procedure Laterality Date   CESAREAN SECTION  1992   COLONOSCOPY  12/04/2014   Moderate predominantly sigmoid diverticulosis. Otherwise normal colonoscopy. The colon was highly redundant   IR ABLATE LIVER CRYOABLATION  06/06/2019   IR ABLATE LIVER CRYOABLATION  07/22/2021   IR ABLATE LIVER CRYOABLATION  07/22/2021   IR ABLATE LIVER CRYOABLATION  07/22/2021   IR ABLATE LIVER CRYOABLATION  07/22/2021   IR RADIOLOGIST EVAL & MGMT  05/29/2019   IR RADIOLOGIST EVAL & MGMT  06/01/2021   KNEE ARTHROSCOPY Right    x2   Low grade squamous intraepithelial dysplasia     TOTAL KNEE ARTHROPLASTY Bilateral 09/30/2015   Procedure: TOTAL KNEE BILATERAL;  Surgeon: Marcene Corning, MD;  Location: [facility] OR;  Service: Orthopedics;  Laterality: Bilateral;  Autologous blood donor    TOTAL KNEE REVISION Right 03/24/2022   Procedure: TOTAL KNEE REVISION;  Surgeon: Ollen Gross, MD;  Location: [facility] ORS;  Service: Orthopedics;  Laterality: Right;    Prior to Admission medications   Medication Sig Start Date End Date Taking? Authorizing Provider  acetaminophen (TYLENOL) 500 MG tablet  Take 1,000 mg by mouth every 6 (six) hours as needed (for pain.).   Yes [provider]  cetirizine (ZYRTEC) 10 MG tablet Take 10 mg by mouth daily.   Yes [provider]  Cholecalciferol (VITAMIN D3) 5000 units TABS Take 5,000 Units by mouth daily.   Yes [provider]  ferrous sulfate 325 (65 FE) MG tablet Take 325 mg by mouth daily.   Yes [provider]  gabapentin (NEURONTIN) 300 MG capsule Take 1 capsule 3 times a day for 2 weeks following surgery.Then take 1 capsule 2 times a day for 2 weeks. Then take 1 capsule daily for 2 weeks. Then discontinue. Patient taking differently: Take 300 mg by mouth every evening. 03/25/22  Yes Eartha Inch, PA  Multiple Vitamin (MULTIVITAMIN WITH MINERALS) TABS tablet Take 1 tablet by mouth daily.   Yes [provider]  rosuvastatin (CRESTOR) 20 MG tablet Take 20 mg by mouth daily.   Yes [provider]  Scar Treatment Products [facility]) GEL Apply 1 Application topically daily.   Yes [provider]  topiramate (TOPAMAX) 100 MG tablet Take 100 mg by mouth daily.   Yes [provider]  aspirin 81 MG chewable tablet Take 1 tablet twice daily for 3 weeks, then take 1 tablet daily for 3 weeks. Patient not taking: Reported on 06/29/2022 03/25/22   Eartha Inch, PA  baclofen (LIORESAL) 10  MG tablet Take 1 tablet (10 mg total) by mouth every 6 (six) hours as needed for muscle spasms. Patient not taking: Reported on 06/29/2022 03/25/22   Cory Munch, PA-C  HYDROmorphone (DILAUDID) 4 MG tablet Take 1/2 - 1 tablet (2 - 4 mg total) by mouth every 6 hours as needed for severe pain. Patient not taking: Reported on 06/29/2022 03/25/22   Eartha Inch, PA  meloxicam (MOBIC) 15 MG tablet TAKE 1 TABLET (15 MG TOTAL) BY MOUTH DAILY. Patient not taking: Reported on 06/29/2022 05/10/22   Standiford, Jenelle Mages, DPM  traMADol (ULTRAM) 50 MG tablet Take 1 - 2 tablets (50 - 100 mg total) by mouth  every 6 hours as needed for moderate pain. Patient not taking: Reported on 06/29/2022 03/25/22   Eartha Inch, PA   KNEE EXAM antalgic gait, no warmth or effusion, range 0-100 degrees, no instability  Physical Examination: General appearance - alert, well appearing, and in no distress Mental status - alert, oriented to person, place, and time Chest - clear to auscultation, no wheezes, rales or rhonchi, symmetric air entry Heart - normal rate, regular rhythm, normal S1, S2, no murmurs, rubs, clicks or gallops Abdomen - soft, nontender, nondistended, no masses or organomegaly Neurological - alert, oriented, normal speech, no focal findings or movement disorder noted   Asessment/Plan--- Right knee arthrofibrosis- - Plan right knee closed manipulation Procedure risks and potential comps discussed with patient who elects to proceed. Goals are decreased pain and increased function with a high likelihood of achieving both

## 2022-07-05 NOTE — Anesthesia Postprocedure Evaluation (Signed)
Anesthesia Post Note  Patient: EMILSE BOEHNLEIN  Procedure(s) Performed: CLOSED MANIPULATION KNEE (Right: Knee)     Patient location during evaluation: PACU Anesthesia Type: General Level of consciousness: awake and alert Pain management: pain level controlled Vital Signs Assessment: post-procedure vital signs reviewed and stable Respiratory status: spontaneous breathing, nonlabored ventilation and respiratory function stable Cardiovascular status: blood pressure returned to baseline and stable Postop Assessment: no apparent nausea or vomiting Anesthetic complications: no  No notable events documented.  Last Vitals:  Vitals:   07/05/22 1645 07/05/22 1700  BP: 115/81 (!) 135/95  Pulse: 63 61  Resp: 16 16  Temp:  36.7 C  SpO2: 98% 99%    Last Pain:  Vitals:   07/05/22 1700  TempSrc:   PainSc: 5                  Adriana Lina,W. EDMOND

## 2022-07-05 NOTE — Anesthesia Preprocedure Evaluation (Addendum)
Anesthesia Evaluation  Patient identified by MRN, date of birth, ID band Patient awake    Reviewed: Allergy & Precautions, H&P , NPO status , Patient's Chart, lab work & pertinent test results  History of Anesthesia Complications (+) POST - OP SPINAL HEADACHE and history of anesthetic complications  Airway Mallampati: II  TM Distance: >3 FB Neck ROM: Full    Dental no notable dental hx. (+) Teeth Intact, Dental Advisory Given   Pulmonary neg pulmonary ROS   Pulmonary exam normal breath sounds clear to auscultation       Cardiovascular hypertension,  Rhythm:Regular Rate:Normal     Neuro/Psych  Headaches  negative psych ROS   GI/Hepatic negative GI ROS, Neg liver ROS,,,  Endo/Other  negative endocrine ROS    Renal/GU negative Renal ROS  negative genitourinary   Musculoskeletal  (+) Arthritis , Osteoarthritis,    Abdominal   Peds  Hematology  (+) Blood dyscrasia, anemia   Anesthesia Other Findings   Reproductive/Obstetrics negative OB ROS                             Anesthesia Physical Anesthesia Plan  ASA: 2  Anesthesia Plan: General   Post-op Pain Management: Ofirmev IV (intra-op)* and Toradol IV (intra-op)*   Induction: Intravenous  PONV Risk Score and Plan: 4 or greater and Ondansetron, Dexamethasone, Propofol infusion and Midazolam  Airway Management Planned: Mask  Additional Equipment:   Intra-op Plan:   Post-operative Plan:   Informed Consent: I have reviewed the patients History and Physical, chart, labs and discussed the procedure including the risks, benefits and alternatives for the proposed anesthesia with the patient or authorized representative who has indicated his/her understanding and acceptance.     Dental advisory given  Plan Discussed with: CRNA  Anesthesia Plan Comments:        Anesthesia Quick Evaluation

## 2022-07-05 NOTE — Transfer of Care (Signed)
Immediate Anesthesia Transfer of Care Note  Patient: Ashley [name]  Procedure(s) Performed: Procedure(s): CLOSED MANIPULATION KNEE (Right)  Patient Location: PACU   Anesthesia Type:MAC  Level of Consciousness: awake, alert  and oriented  Airway & Oxygen Therapy: Patient Spontanous Breathing and Patient connected to nasal cannula oxygen  Post-op Assessment: Report given to RN and Post -op Vital signs reviewed and stable  Post vital signs: Reviewed and stable  Last Vitals:  Vitals:   07/05/22 1139  BP: (!) 144/97  Pulse: 74  Resp: 18  Temp: 36.5 C  SpO2: 99%    Complications: No apparent anesthesia complications

## 2022-07-06 ENCOUNTER — Encounter (HOSPITAL_COMMUNITY): Payer: Self-pay | Admitting: Orthopedic Surgery

## 2022-07-13 ENCOUNTER — Other Ambulatory Visit: Payer: Self-pay | Admitting: Podiatry

## 2023-07-15 ENCOUNTER — Other Ambulatory Visit: Payer: Self-pay | Admitting: Podiatry
